# Patient Record
Sex: Female | Born: 1990 | Race: White | Hispanic: No | Marital: Married | Smoking: Never smoker
Health system: Southern US, Community
[De-identification: ages and names within clinical notes are randomized; demographics above are authoritative.]

## PROBLEM LIST (undated history)

## (undated) ENCOUNTER — Inpatient Hospital Stay (HOSPITAL_COMMUNITY): Payer: Self-pay

## (undated) DIAGNOSIS — Z789 Other specified health status: Secondary | ICD-10-CM

## (undated) DIAGNOSIS — R519 Headache, unspecified: Secondary | ICD-10-CM

## (undated) DIAGNOSIS — R87629 Unspecified abnormal cytological findings in specimens from vagina: Secondary | ICD-10-CM

## (undated) DIAGNOSIS — R51 Headache: Secondary | ICD-10-CM

## (undated) DIAGNOSIS — N83209 Unspecified ovarian cyst, unspecified side: Secondary | ICD-10-CM

## (undated) HISTORY — PX: SALPINGOOPHORECTOMY: SHX82

## (undated) HISTORY — PX: BACK SURGERY: SHX140

## (undated) HISTORY — PX: ANKLE SURGERY: SHX546

---
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
## 2011-06-02 ENCOUNTER — Emergency Department (HOSPITAL_COMMUNITY)
Admission: EM | Admit: 2011-06-02 | Discharge: 2011-06-02 | Disposition: A | Discharge status: Home / Self Care | Payer: BC Managed Care – PPO | Attending: Emergency Medicine | Admitting: Emergency Medicine

## 2011-06-02 ENCOUNTER — Encounter: Payer: Self-pay | Admitting: *Deleted

## 2011-06-02 ENCOUNTER — Emergency Department (HOSPITAL_COMMUNITY): Payer: BC Managed Care – PPO

## 2011-06-02 DIAGNOSIS — G609 Hereditary and idiopathic neuropathy, unspecified: Secondary | ICD-10-CM | POA: Insufficient documentation

## 2011-06-02 DIAGNOSIS — M792 Neuralgia and neuritis, unspecified: Secondary | ICD-10-CM

## 2011-06-02 MED ORDER — PREDNISONE 10 MG PO TABS: ORAL_TABLET | ORAL | Status: DC

## 2011-06-02 NOTE — ED Notes (Signed)
C/o tingling in left foot, states it started 2 days ago, has a history of back and foot surgery

## 2011-06-08 NOTE — ED Provider Notes (Signed)
History     CSN: [id number] Arrival date & time: 06/02/2011  3:28 PM   None     Chief Complaint  Patient presents with  . Extremity Weakness    (Consider location/radiation/quality/duration/timing/severity/associated sxs/prior treatment) Patient is a 20 y.o. female presenting with extremity weakness. The history is provided by the patient and a relative.  Extremity Weakness This is a new problem. Episode onset: 2 days ago. The problem occurs constantly. The problem has been unchanged. Pertinent negatives include no abdominal pain, arthralgias, chest pain, congestion, fever, headaches, joint swelling, nausea, neck pain, numbness, rash, sore throat or weakness. Associated symptoms comments: She describes a pins and needles sensation in her left foot,  Sometimes radiating to her mid anterior tibia for the past 2 days.  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  She has also had a achilles tendon release surgery but continues to have an antalgic gait, and chronically walks on the ball of her left foot only.. The symptoms are aggravated by walking and standing. She has tried rest and acetaminophen for the symptoms. The treatment provided no relief.    History reviewed. No pertinent past medical history.  Past Surgical History  Procedure Date  . Salpingoophorectomy   . Back surgery     No family history on file.  History  Substance Use Topics  . Smoking status: Never Smoker   . Smokeless tobacco: Not on file  . Alcohol Use: No    OB History    Grav Para Term Preterm Abortions TAB SAB Ect Mult Living                  Review of Systems  Constitutional: Negative for fever.  HENT: Negative for congestion, sore throat and neck pain.   Eyes: Negative.   Respiratory: Negative for chest tightness and shortness of breath.   Cardiovascular: Negative for chest pain.  Gastrointestinal: Negative for nausea and  abdominal pain.  Genitourinary: Negative.  Negative for dysuria, urgency, decreased urine volume and enuresis.  Musculoskeletal: Positive for extremity weakness. Negative for joint swelling and arthralgias.  Skin: Negative.  Negative for rash and wound.  Neurological: Negative for dizziness, weakness, light-headedness, numbness and headaches.  Hematological: Negative.   Psychiatric/Behavioral: Negative.     Allergies  Dilaudid  Home Medications   Current Outpatient Rx  Name Route Sig Dispense Refill  . IBUPROFEN 200 MG PO TABS Oral Take 800 mg by mouth every 8 (eight) hours as needed. Bad headaches     . LEVONORGEST-ETH ESTRAD 91-DAY 0.15-0.03 MG PO TABS Oral Take 1 tablet by mouth daily.      Marland Kitchen PREDNISONE 10 MG PO TABS  Take 6 tabs daily by mouth for 1 day,  Then 5 tabs daily for 1 day,  4 tabs daily for 1 day,  3 tabs daily for 1 day,  2 tabs daily for 1 day,  Then 1 tab daily for 1 day.   21 tablet 0    BP 126/76  Pulse 100  Temp(Src) 98.9 F (37.2 C) (Oral)  Resp 20  Ht 5' (1.524 m)  Wt 115 lb (52.164 kg)  BMI 22.46 kg/m2  SpO2 100%  LMP 05/21/2011  Physical Exam  Constitutional: She is oriented to person, place, and time. She appears well-developed and well-nourished.  HENT:  Head: Normocephalic.  Eyes: Conjunctivae are normal.  Neck: Normal range of motion. Neck supple.  Cardiovascular: Regular rhythm and intact distal pulses.        Pedal pulses normal.  Pulmonary/Chest: Effort normal. She has no wheezes.  Abdominal: Soft. Bowel sounds are normal. She exhibits no distension and no mass.  Musculoskeletal: She exhibits no edema and no tenderness.       Lumbar back: She exhibits tenderness. She exhibits no swelling, no edema and no spasm.       Old healed midline lumbar incision.  Asymmetry noted to posterior pelvis and L spine.  Neurological: She is alert and oriented to person, place, and time. She has normal strength. She displays no atrophy and no tremor. No  cranial nerve deficit or sensory deficit. Gait normal.  Reflex Scores:      Patellar reflexes are 2+ on the right side and 2+ on the left side.      Achilles reflexes are 2+ on the right side and 2+ on the left side.      No strength deficit noted in hip and knee flexor and extensor muscle groups.  Ankle flexion and extension intact.  Skin: Skin is warm and dry.  Psychiatric: She has a normal mood and affect.    ED Course  Procedures (including critical care time)   Labs Reviewed  POCT PREGNANCY, URINE  LAB REPORT - SCANNED   No results found.   1. Peripheral neuralgia       MDM  Patients labs and/or radiological studies were reviewed during the medical decision making and disposition process.  Referral to neurology for further testing/ nerve study.  Exam and sx suggestive of foot neuropathy rather than lumbar radicular pain.        Candis Musa, PA 06/08/11 2137  Candis Musa, PA 06/09/11 [phone]

## 2011-06-18 NOTE — ED Provider Notes (Signed)
Medical screening examination/treatment/procedure(s) were performed by non-physician practitioner and as supervising physician I was immediately available for consultation/collaboration.  Hurman Horn, MD 06/18/11 [phone]

## 2012-04-14 ENCOUNTER — Encounter (HOSPITAL_COMMUNITY): Payer: Self-pay | Admitting: Emergency Medicine

## 2012-04-14 ENCOUNTER — Emergency Department (HOSPITAL_COMMUNITY)
Admission: EM | Admit: 2012-04-14 | Discharge: 2012-04-14 | Disposition: A | Discharge status: Home / Self Care | Payer: BC Managed Care – PPO | Attending: Emergency Medicine | Admitting: Emergency Medicine

## 2012-04-14 DIAGNOSIS — N390 Urinary tract infection, site not specified: Secondary | ICD-10-CM | POA: Insufficient documentation

## 2012-04-14 LAB — URINALYSIS, ROUTINE W REFLEX MICROSCOPIC
Bilirubin Urine: NEGATIVE
Glucose, UA: NEGATIVE mg/dL
Specific Gravity, Urine: 1.02 (ref 1.005–1.030)

## 2012-04-14 LAB — URINE MICROSCOPIC-ADD ON

## 2012-04-14 LAB — PREGNANCY, URINE: Preg Test, Ur: NEGATIVE

## 2012-04-14 MED ORDER — NITROFURANTOIN MONOHYD MACRO 100 MG PO CAPS
100.0000 mg | ORAL_CAPSULE | Freq: Once | ORAL | Status: AC
  Administered 2012-04-14: 100 mg via ORAL
  Filled 2012-04-14: qty 1

## 2012-04-14 MED ORDER — NITROFURANTOIN MONOHYD MACRO 100 MG PO CAPS: 100.0000 mg | ORAL_CAPSULE | Freq: Two times a day (BID) | ORAL | Status: DC

## 2012-04-14 NOTE — ED Provider Notes (Signed)
History     CSN: [id number]  Arrival date & time 04/14/12  0959   First MD Initiated Contact with Patient 04/14/12 1012      Chief Complaint  Patient presents with  . Urinary Frequency    (Consider location/radiation/quality/duration/timing/severity/associated sxs/prior treatment) HPI Comments: Urinary freq and urgency.  No hematuria or dysuria.  + malodorous.  No fever or chills.  No n/v.  No vaginal d/c   Patient is a 21 y.o. female presenting with frequency. The history is provided by the patient. No language interpreter was used.  Urinary Frequency This is a new problem. The current episode started yesterday. The problem occurs constantly. The problem has been unchanged. Associated symptoms include urinary symptoms. Pertinent negatives include no abdominal pain, chills, diaphoresis, fever, nausea or vomiting. Associated symptoms comments: Back pain . Nothing aggravates the symptoms. She has tried nothing for the symptoms.    History reviewed. No pertinent past medical history.  Past Surgical History  Procedure Date  . Salpingoophorectomy   . Back surgery   . Ankle surgery     No family history on file.  History  Substance Use Topics  . Smoking status: Never Smoker   . Smokeless tobacco: Not on file  . Alcohol Use: No    OB History    Grav Para Term Preterm Abortions TAB SAB Ect Mult Living                  Review of Systems  Constitutional: Negative for fever, chills and diaphoresis.  Gastrointestinal: Negative for nausea, vomiting and abdominal pain.  Genitourinary: Positive for urgency and frequency. Negative for dysuria, hematuria, flank pain, vaginal bleeding and vaginal discharge.  Musculoskeletal: Positive for back pain.  All other systems reviewed and are negative.    Allergies  Dilaudid  Home Medications   Current Outpatient Rx  Name Route Sig Dispense Refill  . IBUPROFEN 200 MG PO TABS Oral Take 800 mg by mouth every 8 (eight) hours as  needed. Bad headaches     . LEVONORGEST-ETH ESTRAD 91-DAY 0.15-0.03 MG PO TABS Oral Take 1 tablet by mouth daily.      Marland Kitchen NITROFURANTOIN MONOHYD MACRO 100 MG PO CAPS Oral Take 1 capsule (100 mg total) by mouth 2 (two) times daily. 10 capsule 0  . PREDNISONE 10 MG PO TABS  Take 6 tabs daily by mouth for 1 day,  Then 5 tabs daily for 1 day,  4 tabs daily for 1 day,  3 tabs daily for 1 day,  2 tabs daily for 1 day,  Then 1 tab daily for 1 day.   21 tablet 0    BP 132/79  Pulse 100  Temp 98.5 F (36.9 C)  Resp 18  Ht 5' (1.524 m)  Wt 120 lb (54.432 kg)  BMI 23.44 kg/m2  SpO2 99%  LMP 01/14/2012  Physical Exam  Nursing note and vitals reviewed. Constitutional: She is oriented to person, place, and time. She appears well-developed and well-nourished. No distress.  HENT:  Head: Normocephalic and atraumatic.  Eyes: EOM are normal.  Neck: Normal range of motion.  Cardiovascular: Normal rate, regular rhythm and normal heart sounds.   Pulmonary/Chest: Effort normal and breath sounds normal.  Abdominal: Soft. She exhibits no distension. There is no tenderness.  Genitourinary: No vaginal discharge found.  Musculoskeletal: Normal range of motion.       Lumbar back: She exhibits pain. She exhibits normal range of motion, no tenderness and no bony tenderness.  Back:  Neurological: She is alert and oriented to person, place, and time.  Skin: Skin is warm and dry.  Psychiatric: She has a normal mood and affect. Judgment normal.    ED Course  Procedures (including critical care time)  Labs Reviewed  URINALYSIS, ROUTINE W REFLEX MICROSCOPIC - Abnormal; Notable for the following:    APPearance CLOUDY (*)     Hgb urine dipstick MODERATE (*)     Nitrite POSITIVE (*)     Leukocytes, UA TRACE (*)     All other components within normal limits  URINE MICROSCOPIC-ADD ON - Abnormal; Notable for the following:    Squamous Epithelial / LPF FEW (*)     Bacteria, UA MANY (*)     All other  components within normal limits  PREGNANCY, URINE  URINE CULTURE   No results found.   1. UTI (urinary tract infection)       MDM  Macrobid, 10 AZO F/u with PCP prn        Evalina Field, PA 04/14/12 1154

## 2012-04-14 NOTE — ED Notes (Signed)
Patient with c/o urinary frequency and pelvic pain that started yesterday.

## 2012-04-14 NOTE — ED Provider Notes (Signed)
Medical screening examination/treatment/procedure(s) were performed by non-physician practitioner and as supervising physician I was immediately available for consultation/collaboration.  Cera Rorke, MD 04/14/12 1459 

## 2012-04-15 ENCOUNTER — Emergency Department (HOSPITAL_COMMUNITY): Payer: BC Managed Care – PPO

## 2012-04-15 ENCOUNTER — Emergency Department (HOSPITAL_COMMUNITY)
Admission: EM | Admit: 2012-04-15 | Discharge: 2012-04-16 | Disposition: A | Discharge status: Home / Self Care | Payer: BC Managed Care – PPO | Attending: Emergency Medicine | Admitting: Emergency Medicine

## 2012-04-15 ENCOUNTER — Encounter (HOSPITAL_COMMUNITY): Payer: Self-pay | Admitting: *Deleted

## 2012-04-15 DIAGNOSIS — R109 Unspecified abdominal pain: Secondary | ICD-10-CM | POA: Insufficient documentation

## 2012-04-15 DIAGNOSIS — Z79899 Other long term (current) drug therapy: Secondary | ICD-10-CM | POA: Insufficient documentation

## 2012-04-15 DIAGNOSIS — N12 Tubulo-interstitial nephritis, not specified as acute or chronic: Secondary | ICD-10-CM | POA: Insufficient documentation

## 2012-04-15 DIAGNOSIS — N200 Calculus of kidney: Secondary | ICD-10-CM | POA: Insufficient documentation

## 2012-04-15 LAB — URINALYSIS, ROUTINE W REFLEX MICROSCOPIC
Leukocytes, UA: NEGATIVE
Nitrite: POSITIVE — AB
Specific Gravity, Urine: 1.025 (ref 1.005–1.030)
pH: 5 (ref 5.0–8.0)

## 2012-04-15 LAB — PREGNANCY, URINE: Preg Test, Ur: NEGATIVE

## 2012-04-15 LAB — URINE MICROSCOPIC-ADD ON

## 2012-04-15 MED ORDER — MORPHINE SULFATE 2 MG/ML IJ SOLN
INTRAMUSCULAR | Status: AC
  Administered 2012-04-16: 6 mg via INTRAVENOUS
  Filled 2012-04-15: qty 1

## 2012-04-15 MED ORDER — SODIUM CHLORIDE 0.9 % IV SOLN
1000.0000 mL | Freq: Once | INTRAVENOUS | Status: AC
  Administered 2012-04-16: 1000 mL via INTRAVENOUS

## 2012-04-15 MED ORDER — MORPHINE SULFATE 2 MG/ML IJ SOLN
INTRAMUSCULAR | Status: AC
  Filled 2012-04-15: qty 2

## 2012-04-15 MED ORDER — ONDANSETRON HCL 4 MG/2ML IJ SOLN
4.0000 mg | Freq: Once | INTRAMUSCULAR | Status: AC
  Administered 2012-04-16: 4 mg via INTRAVENOUS
  Filled 2012-04-15: qty 2

## 2012-04-15 MED ORDER — MORPHINE SULFATE 4 MG/ML IJ SOLN: 6.0000 mg | Freq: Once | INTRAMUSCULAR | Status: AC

## 2012-04-15 MED ORDER — SODIUM CHLORIDE 0.9 % IV SOLN: 1000.0000 mL | INTRAVENOUS | Status: DC

## 2012-04-15 MED ORDER — ACETAMINOPHEN 500 MG PO TABS
1000.0000 mg | ORAL_TABLET | Freq: Once | ORAL | Status: AC
  Administered 2012-04-16: 1000 mg via ORAL
  Filled 2012-04-15: qty 2

## 2012-04-15 MED ORDER — CEFTRIAXONE SODIUM 1 G IJ SOLR
1.0000 g | Freq: Once | INTRAMUSCULAR | Status: AC
  Administered 2012-04-16: 1 g via INTRAMUSCULAR
  Filled 2012-04-15: qty 10

## 2012-04-15 NOTE — ED Notes (Signed)
Pt states R upper abd pain started this am. Pt has had N/V no diarrhea. Pt has not had pain of this type before

## 2012-04-15 NOTE — ED Provider Notes (Signed)
History   This chart was scribed for Kathleen Co, MD by [name]. The patient was seen in room [facility] and the patient's care was started at 11:29 PM    CSN: [id number]  Arrival date & time 04/15/12  2205   First MD Initiated Contact with Patient 04/15/12 2309      Chief Complaint  Patient presents with  . Abdominal Pain  . Emesis    (Consider location/radiation/quality/duration/timing/severity/associated sxs/prior treatment) Patient is a 21 y.o. female presenting with abdominal pain. The history is provided by the patient. No language interpreter was used.  Abdominal Pain The primary symptoms of the illness include abdominal pain, fever (101.9), nausea and vomiting. The current episode started 6 to 12 hours ago. The onset of the illness was sudden. The problem has been gradually worsening.  The abdominal pain began 6 to 12 hours ago. The pain came on suddenly. The abdominal pain has been gradually worsening since its onset. The abdominal pain is located in the RUQ. The abdominal pain does not radiate.  The fever began today. The maximum temperature recorded prior to her arrival was 101 to 101.9 F. The temperature was taken by an oral thermometer.  Nausea began today.  The vomiting began today. Vomiting occurred once. The emesis contains stomach contents.    Kathleen Rich is a 21 y.o. female , with a hx of UTI, who presents to the Emergency Department complaining of sudden, progressively worsening, abdominal pain located at the right side of the abdomen onset today with associated symptoms of vomiting, nausea, back pain, and fever (101.9 at [facility]). The pt reports she was experiencing suprapubic abdominal pain yesterday where which she was diagnosed with a UTI by Al Decant at [facility]. The pt informs that the abdominal pain she was experiencing yesterday has resolved, however, she is experiencing a right sided pain at present. The pt has a hx of UTI (diagnosed with Al Decant  yesterday, 04/14/12).   The pt does not smoke or drink alcohol.      History reviewed. No pertinent past medical history.  Past Surgical History  Procedure Date  . Salpingoophorectomy   . Back surgery   . Ankle surgery     History reviewed. No pertinent family history.  History  Substance Use Topics  . Smoking status: Never Smoker   . Smokeless tobacco: Not on file  . Alcohol Use: No    OB History    Grav Para Term Preterm Abortions TAB SAB Ect Mult Living                  Review of Systems  Constitutional: Positive for fever (101.9).  Gastrointestinal: Positive for nausea, vomiting and abdominal pain.  All other systems reviewed and are negative.    Allergies  Dilaudid  Home Medications   Current Outpatient Rx  Name Route Sig Dispense Refill  . IBUPROFEN 200 MG PO TABS Oral Take 800 mg by mouth every 8 (eight) hours as needed. Bad headaches     . LEVONORGEST-ETH ESTRAD 91-DAY 0.15-0.03 MG PO TABS Oral Take 1 tablet by mouth daily.      Marland Kitchen NITROFURANTOIN MONOHYD MACRO 100 MG PO CAPS Oral Take 1 capsule (100 mg total) by mouth 2 (two) times daily. 10 capsule 0  . AZO TABS PO Oral Take 1 tablet by mouth daily as needed. For urinary pain relief      BP 145/84  Pulse 127  Temp 100.2 F (37.9 C) (Oral)  Resp 20  Ht 5' (1.524 m)  Wt 120 lb (54.432 kg)  BMI 23.44 kg/m2  SpO2 100%  LMP 01/14/2012  Physical Exam  Nursing note and vitals reviewed. Constitutional: She is oriented to person, place, and time. She appears well-developed and well-nourished. No distress.  HENT:  Head: Normocephalic and atraumatic.  Eyes: EOM are normal. Pupils are equal, round, and reactive to light.  Neck: Normal range of motion. Neck supple. No tracheal deviation present.  Cardiovascular: Normal rate, regular rhythm and normal heart sounds.   Pulmonary/Chest: Effort normal and breath sounds normal. No respiratory distress.  Abdominal: Soft. She exhibits no distension. There is  tenderness (Mild right sided abdominal tenderness. ).  Musculoskeletal: Normal range of motion. She exhibits tenderness (Right CVA tenderness. ). She exhibits no edema.  Neurological: She is alert and oriented to person, place, and time. No sensory deficit.  Skin: Skin is warm and dry.  Psychiatric: She has a normal mood and affect. Her behavior is normal. Judgment normal.    ED Course  Procedures (including critical care time)  DIAGNOSTIC STUDIES: Oxygen Saturation is 100% on room air, normal by my interpretation.    COORDINATION OF CARE:    11:33PM- IV antibiotics, CT scan, nausea management, pain management, review of previous visit with Al Decant on 04/14/12, and possible kidney stones discussed. Pt agrees with treatment.   Labs Reviewed  URINALYSIS, ROUTINE W REFLEX MICROSCOPIC - Abnormal; Notable for the following:    Glucose, UA 100 (*)     Hgb urine dipstick SMALL (*)     Protein, ur TRACE (*)     Nitrite POSITIVE (*)     All other components within normal limits  URINE MICROSCOPIC-ADD ON - Abnormal; Notable for the following:    Squamous Epithelial / LPF MANY (*)     Bacteria, UA MANY (*)     All other components within normal limits  CBC - Abnormal; Notable for the following:    WBC 13.5 (*)     All other components within normal limits  COMPREHENSIVE METABOLIC PANEL - Abnormal; Notable for the following:    Potassium 3.4 (*)     All other components within normal limits  PREGNANCY, URINE  LIPASE, BLOOD  URINE CULTURE   Ct Abdomen Pelvis Wo Contrast  04/16/2012  *RADIOLOGY REPORT*  Clinical Data: Right side abdominal pain and fever.  Urinary tract infection.  CT ABDOMEN AND PELVIS WITHOUT CONTRAST  Technique:  Multidetector CT imaging of the abdomen and pelvis was performed following the standard protocol without intravenous contrast.  Comparison: CT abdomen pelvis 12/04/2009.  Findings: The lung bases are clear.  No pleural or pericardial effusion.  The patient  has multiple bilateral nonobstructing renal stones. There is some stranding about the proximal right ureter.  No hydronephrosis or ureteral stones are identified.  The urinary bladder appears normal.  The liver, gallbladder, adrenal glands, spleen and pancreas appear normal.  Uterus and adnexa are unremarkable.  The stomach, small and large bowel and appendix appear normal.  There is no lymphadenopathy or fluid.  Patient reports history of prior back surgery.  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  IMPRESSION:  1.  Stranding about the right renal pelvis and proximal ureter could be due to urinary tract infection or possible recent stone passage.  No ureteral or urinary bladder stone is identified. 2.  Bilateral nonobstructing renal stones.   Original Report Authenticated By: Bernadene Bell. Maricela Curet, M.D.  I personally reviewed the imaging tests through PACS system  I reviewed available ER/hospitalization records thought the EMR   1. Pyelonephritis       MDM  3:01 AM The patient feels much better at this time.  She has right-sided pyelonephritis.  CT scan without evidence of obstructing stone to complicate this.  Her heart rate and pulse rate are significantly improved after IV fluids and tylenol. Will change to 10 day course of keflex. Close pcp follow up. Strict return instructions      I personally performed the services described in this documentation, which was scribed in my presence. The recorded information has been reviewed and considered.      Kathleen Co, MD 04/16/12 [phone]

## 2012-04-15 NOTE — ED Notes (Signed)
Pt c/o ruq abd pain and vomiting.

## 2012-04-16 LAB — CBC
MCH: 32.2 pg (ref 26.0–34.0)
Platelets: 205 10*3/uL (ref 150–400)
RBC: 3.94 MIL/uL (ref 3.87–5.11)

## 2012-04-16 LAB — COMPREHENSIVE METABOLIC PANEL
ALT: 17 U/L (ref 0–35)
AST: 23 U/L (ref 0–37)
CO2: 23 mEq/L (ref 19–32)
Calcium: 9.3 mg/dL (ref 8.4–10.5)
GFR calc non Af Amer: >90 mL/min (ref >90)
Sodium: 136 mEq/L (ref 135–145)
Total Protein: 7.3 g/dL (ref 6.0–8.3)

## 2012-04-16 LAB — URINE CULTURE

## 2012-04-16 MED ORDER — MORPHINE SULFATE 4 MG/ML IJ SOLN: 6.0000 mg | Freq: Once | INTRAMUSCULAR | Status: AC

## 2012-04-16 MED ORDER — ONDANSETRON 8 MG PO TBDP: 8.0000 mg | ORAL_TABLET | Freq: Three times a day (TID) | ORAL | Status: AC | PRN

## 2012-04-16 MED ORDER — OXYCODONE-ACETAMINOPHEN 5-325 MG PO TABS: 1.0000 | ORAL_TABLET | ORAL | Status: AC | PRN

## 2012-04-16 MED ORDER — MORPHINE SULFATE 10 MG/ML IJ SOLN
INTRAMUSCULAR | Status: AC
  Administered 2012-04-16: 6 mg
  Filled 2012-04-16: qty 1

## 2012-04-16 MED ORDER — CEPHALEXIN 500 MG PO CAPS: 500.0000 mg | ORAL_CAPSULE | Freq: Four times a day (QID) | ORAL | Status: AC

## 2012-04-16 NOTE — ED Notes (Signed)
Pt stable at discharge with a steady ambulatory gait; Pt instructed not to take extra tylenol with pain medication and also instructed not to drive while on pain medication. Pt verbalizes understanding. Questions answered. Pt instructed to finish all antibiotics

## 2012-04-16 NOTE — ED Notes (Signed)
Doctor [place] checked the temp  At 00:11. The temp was 101.9 oral.

## 2012-04-17 NOTE — ED Notes (Signed)
+   urine  Patient treated appropriately -sensitive to same-chart appended per protocol MD.  

## 2012-04-18 LAB — URINE CULTURE

## 2012-10-19 ENCOUNTER — Emergency Department (HOSPITAL_COMMUNITY)
Admission: EM | Admit: 2012-10-19 | Discharge: 2012-10-19 | Disposition: A | Discharge status: Home / Self Care | Payer: BC Managed Care – PPO | Attending: Emergency Medicine | Admitting: Emergency Medicine

## 2012-10-19 ENCOUNTER — Encounter (HOSPITAL_COMMUNITY): Payer: Self-pay | Admitting: *Deleted

## 2012-10-19 ENCOUNTER — Emergency Department (HOSPITAL_COMMUNITY): Payer: BC Managed Care – PPO

## 2012-10-19 DIAGNOSIS — Z79899 Other long term (current) drug therapy: Secondary | ICD-10-CM | POA: Insufficient documentation

## 2012-10-19 DIAGNOSIS — S93409A Sprain of unspecified ligament of unspecified ankle, initial encounter: Secondary | ICD-10-CM | POA: Insufficient documentation

## 2012-10-19 DIAGNOSIS — Z9889 Other specified postprocedural states: Secondary | ICD-10-CM | POA: Insufficient documentation

## 2012-10-19 DIAGNOSIS — S93402A Sprain of unspecified ligament of left ankle, initial encounter: Secondary | ICD-10-CM

## 2012-10-19 DIAGNOSIS — Y929 Unspecified place or not applicable: Secondary | ICD-10-CM | POA: Insufficient documentation

## 2012-10-19 DIAGNOSIS — Y9389 Activity, other specified: Secondary | ICD-10-CM | POA: Insufficient documentation

## 2012-10-19 DIAGNOSIS — X58XXXA Exposure to other specified factors, initial encounter: Secondary | ICD-10-CM | POA: Insufficient documentation

## 2012-10-19 MED ORDER — HYDROCODONE-ACETAMINOPHEN 5-325 MG PO TABS
ORAL_TABLET | ORAL | Status: AC
  Administered 2012-10-19: 1 via ORAL
  Filled 2012-10-19: qty 1

## 2012-10-19 MED ORDER — HYDROCODONE-ACETAMINOPHEN 5-325 MG PO TABS
1.0000 | ORAL_TABLET | Freq: Once | ORAL | Status: AC
  Administered 2012-10-19: 1 via ORAL

## 2012-10-19 MED ORDER — IBUPROFEN 600 MG PO TABS: 600.0000 mg | ORAL_TABLET | Freq: Four times a day (QID) | ORAL | Status: DC | PRN

## 2012-10-19 MED ORDER — HYDROCODONE-ACETAMINOPHEN 5-325 MG PO TABS: ORAL_TABLET | ORAL | Status: DC

## 2012-10-19 NOTE — ED Provider Notes (Signed)
History     CSN: [id number]  Arrival date & time 10/19/12  2148   First MD Initiated Contact with Patient 10/19/12 2204      Chief Complaint  Patient presents with  . Ankle Pain    (Consider location/radiation/quality/duration/timing/severity/associated sxs/prior treatment) Patient is a 22 y.o. female presenting with ankle pain. The history is provided by the patient.  Ankle Pain Location:  Ankle Time since incident:  2 days Lower extremity injury: unknown.   Ankle location:  L ankle Pain details:    Quality:  Aching and tingling   Radiates to:  Does not radiate   Severity:  Mild   Onset quality:  Gradual   Timing:  Constant   Progression:  Unchanged Chronicity:  New Dislocation: no   Foreign body present:  No foreign bodies Prior injury to area: patient reports hx of surgery to her left Achille's tendon. Relieved by:  Ice Worsened by:  Nothing tried Ineffective treatments:  None tried Associated symptoms: decreased ROM, swelling and tingling   Associated symptoms: no back pain, no fever, no itching, no muscle weakness, no neck pain, no numbness and no stiffness   Associated symptoms comment:  Bruising to the lateral ankle Risk factors: no frequent fractures, no known bone disorder and no obesity     History reviewed. No pertinent past medical history.  Past Surgical History  Procedure Laterality Date  . Salpingoophorectomy    . Back surgery    . Ankle surgery      History reviewed. No pertinent family history.  History  Substance Use Topics  . Smoking status: Never Smoker   . Smokeless tobacco: Not on file  . Alcohol Use: No    OB History   Grav Para Term Preterm Abortions TAB SAB Ect Mult Living                  Review of Systems  Constitutional: Negative for fever and chills.  HENT: Negative for neck pain.   Genitourinary: Negative for dysuria and difficulty urinating.  Musculoskeletal: Positive for joint swelling and arthralgias. Negative for  back pain and stiffness.       Bruising to the left ankle  Skin: Negative for color change, itching and wound.  Neurological: Negative for weakness and numbness.  Hematological: Does not bruise/bleed easily.  All other systems reviewed and are negative.    Allergies  Dilaudid  Home Medications   Current Outpatient Rx  Name  Route  Sig  Dispense  Refill  . levonorgestrel-ethinyl estradiol (SEASONALE,INTROVALE,JOLESSA) 0.15-0.03 MG tablet   Oral   Take 1 tablet by mouth daily.             BP 135/79  Pulse 95  Temp(Src) 97.3 F (36.3 C) (Oral)  Resp 18  Ht 4\' 11"  (1.499 m)  Wt 120 lb (54.432 kg)  BMI 24.22 kg/m2  SpO2 100%  Physical Exam  Nursing note and vitals reviewed. Constitutional: She is oriented to person, place, and time. She appears well-developed and well-nourished. No distress.  HENT:  Head: Normocephalic and atraumatic.  Cardiovascular: Normal rate, regular rhythm, normal heart sounds and intact distal pulses.   Pulmonary/Chest: Effort normal and breath sounds normal. No respiratory distress.  Musculoskeletal: She exhibits edema and tenderness.  Left lateral ankle is ttp, mild STS and bruising are present.  ROM is preserved.  DP pulse is brisk, distal sensation intact.  No erythema, abrasion,or bony deformity.  No  Proximal tenderness  Neurological: She is alert and oriented  to person, place, and time. She exhibits normal muscle tone. Coordination normal.  Skin: Skin is warm and dry.    ED Course  Procedures (including critical care time)  Labs Reviewed - No data to display No results found.    Dg Ankle Complete Left  10/19/2012  *RADIOLOGY REPORT*  Clinical Data: 22 year old female left lateral ankle pain with swelling and bruising.  LEFT ANKLE COMPLETE - 3+ VIEW  Comparison: None.  Findings: Bone mineralization is within normal limits.  No joint effusion identified.  Calcaneus intact.  Mortise joint alignment within normal limits.  Talar dome intact.   No fracture or dislocation identified.  IMPRESSION: No acute fracture or dislocation identified about the left ankle.   Original Report Authenticated By: Erskine Speed, M.D.     MDM   ASO ankle brace applied and crutches given.  Pain improved.  Remains NV intact.  No proximal edema or tenderness. Likely sprain.   Pt agrees to RICE therapy and f/u with Dr. Romeo Apple if needed.    Prescribed:  Ibuprofen norco #12      Diante Barley L. Mondamin, [place] 10/19/12 2319

## 2012-10-19 NOTE — ED Notes (Signed)
Pt c/o left ankle pain x 2 days 

## 2012-10-21 NOTE — ED Provider Notes (Signed)
Medical screening examination/treatment/procedure(s) were performed by non-physician practitioner and as supervising physician I was immediately available for consultation/collaboration.  Barbarajean Kinzler, MD 10/21/12 0659 

## 2012-11-13 ENCOUNTER — Encounter: Payer: Self-pay | Admitting: Orthopedic Surgery

## 2012-11-13 ENCOUNTER — Ambulatory Visit (INDEPENDENT_AMBULATORY_CARE_PROVIDER_SITE_OTHER): Payer: BC Managed Care – PPO | Admitting: Orthopedic Surgery

## 2012-11-13 VITALS — BP 102/64 | Ht 60.0 in | Wt 123.0 lb

## 2012-11-13 DIAGNOSIS — Q667 Congenital pes cavus, unspecified foot: Secondary | ICD-10-CM

## 2012-11-13 DIAGNOSIS — Q675 Congenital deformity of spine: Secondary | ICD-10-CM

## 2012-11-13 DIAGNOSIS — M216X9 Other acquired deformities of unspecified foot: Secondary | ICD-10-CM

## 2012-11-13 DIAGNOSIS — M24576 Contracture, unspecified foot: Secondary | ICD-10-CM

## 2012-11-13 DIAGNOSIS — Q059 Spina bifida, unspecified: Secondary | ICD-10-CM

## 2012-11-13 DIAGNOSIS — M24575 Contracture, left foot: Secondary | ICD-10-CM

## 2012-11-13 NOTE — Progress Notes (Signed)
Patient ID: Kathleen Rich, female   DOB: [birth year], 22 y.o.   MRN: [id number] Chief Complaint  Patient presents with  . Ankle Pain    Left ankle and foot pain    22 year old female presents with lateral foot and ankle pain and a feeling that her circulation is poor as the foot states cold.  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 She was initially treated at [facility] but has not been there since a youngster.  She went to the emergency room with pain in her foot and ankle for 2-3 months which is worse after standing at work.  She describes a cold feeling of the foot numbness and tingling swelling 8/10 throbbing pain which is worse by standing  Review of systems blurred vision nausea diarrhea swelling of the foot and ankle numbness and tingling in the foot she denies weight loss chest pain shortness of breath genitourinary symptoms of frequency or urgency. Denies easy bleeding or bruising  History reviewed. No pertinent past medical history.  Past Surgical History  Procedure Laterality Date  . Salpingoophorectomy    . Back surgery    . Ankle surgery      History  Substance Use Topics  . Smoking status: Never Smoker   . Smokeless tobacco: Not on file  . Alcohol Use: No     Vital signs are stable as recorded  General appearance is normal  The patient is alert and oriented x3  The patient's mood and affect are normal  Gait assessment: She does have an altered gait mainly because she cannot bring the left foot down to the floor in a plantigrade fashion and she has a leg length discrepancy which is approximately 1 cm    The cardiovascular exam reveals normal pulses but abnormal temperature with a cool feeling to the left foot compared to the right, however without edema or  swelling.  The lymphatic system is negative for  palpable lymph nodes  The sensory exam is normal.  There are no pathologic reflexes.  Balance is normal.   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 Come in the ankle joint feels stable. There is mild atrophy and the foot is smaller in length and overall appearance  The x-ray of the ankle showed no acute fracture  We are going to get the patient connected back to [facility] where she can have foot and ankle consultation along with spinal/neurosurgical evaluation and further treatment  Diagnosis plantar flexion contracture Spinal dysraphism Foot deformity Leg length discrepancy

## 2012-11-13 NOTE — Patient Instructions (Signed)
Refer back to [facility]

## 2012-12-30 ENCOUNTER — Telehealth: Payer: Self-pay | Admitting: *Deleted

## 2012-12-30 NOTE — Telephone Encounter (Signed)
Patient cancelled appointment with Dr. Oswaldo Done per receptionist at [facility]

## 2014-06-02 ENCOUNTER — Encounter: Payer: Self-pay | Admitting: Nurse Practitioner

## 2014-07-30 ENCOUNTER — Encounter (HOSPITAL_COMMUNITY): Payer: Self-pay

## 2014-07-30 ENCOUNTER — Emergency Department (HOSPITAL_COMMUNITY): Payer: BC Managed Care – PPO

## 2014-07-30 ENCOUNTER — Emergency Department (HOSPITAL_COMMUNITY)
Admission: EM | Admit: 2014-07-30 | Discharge: 2014-07-30 | Disposition: A | Discharge status: Home / Self Care | Payer: BC Managed Care – PPO | Attending: Emergency Medicine | Admitting: Emergency Medicine

## 2014-07-30 DIAGNOSIS — M25561 Pain in right knee: Secondary | ICD-10-CM | POA: Diagnosis present

## 2014-07-30 DIAGNOSIS — R52 Pain, unspecified: Secondary | ICD-10-CM

## 2014-07-30 DIAGNOSIS — Z79899 Other long term (current) drug therapy: Secondary | ICD-10-CM | POA: Diagnosis not present

## 2014-07-30 DIAGNOSIS — Z9889 Other specified postprocedural states: Secondary | ICD-10-CM | POA: Insufficient documentation

## 2014-07-30 MED ORDER — TRAMADOL HCL 50 MG PO TABS: 50.0000 mg | ORAL_TABLET | Freq: Four times a day (QID) | ORAL | Status: DC | PRN

## 2014-07-30 NOTE — ED Notes (Signed)
Pt c/o of right knee pain. Denies injury. States left leg shorter than right leg and compensates by bearing weight only on right foot.

## 2014-07-30 NOTE — Discharge Instructions (Signed)

## 2014-08-01 NOTE — ED Provider Notes (Signed)
CSN: [id number]     Arrival date & time 07/30/14  1656 History   First MD Initiated Contact with Patient 07/30/14 1734     Chief Complaint  Patient presents with  . Knee Pain     (Consider location/radiation/quality/duration/timing/severity/associated sxs/prior Treatment) Patient is a 23 y.o. female presenting with knee pain. The history is provided by the patient.  Knee Pain Location:  Knee Injury: no   Knee location:  R knee Pain details:    Quality:  Aching   Radiates to:  Does not radiate   Severity:  Moderate   Onset quality:  Gradual   Duration:  2 days   Pain timing: constant with weight bearing,  better at rest.   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 No pertinent past medical history. Past Surgical History  Procedure Laterality Date  . Salpingoophorectomy    . Back surgery    . Ankle surgery     History reviewed. No pertinent family history. History  Substance Use Topics  . Smoking status: Never Smoker   . Smokeless tobacco: Not on file  . Alcohol Use: No   OB History    No data available     Review of Systems  Constitutional: Negative for fever.  Musculoskeletal: Positive for joint swelling and arthralgias. Negative for myalgias and back pain.  Neurological: Negative for weakness and numbness.      Allergies  Dilaudid  Home Medications   Prior to Admission medications   Medication Sig Start Date End Date  Taking? Authorizing Provider  HYDROcodone-acetaminophen (NORCO/VICODIN) 5-325 MG per tablet Take one tab po q 4-6 hrs prn pain 10/19/12   Tammy L. Triplett, PA-C  ibuprofen (ADVIL,MOTRIN) 600 MG tablet Take 1 tablet (600 mg total) by mouth every 6 (six) hours as needed for pain. Take with food 10/19/12   Tammy L. Triplett, PA-C  levonorgestrel-ethinyl estradiol (SEASONALE,INTROVALE,JOLESSA) 0.15-0.03 MG tablet Take 1 tablet by mouth daily.      Historical Provider, MD  traMADol (ULTRAM) 50 MG tablet Take 1 tablet (50 mg total) by mouth every 6 (six) hours as needed. 07/30/14   Burgess AmorJulie Marquarius Lofton, PA-C   BP 122/70 mmHg  Pulse 91  Temp(Src) 98.9 F (37.2 C) (Oral)  Resp 16  Ht 5' (1.524 m)  Wt 125 lb (56.7 kg)  BMI 24.41 kg/m2  SpO2 100%  LMP 07/30/2014 Physical Exam  Constitutional: She appears well-developed and well-nourished.  HENT:  Head: Atraumatic.  Neck: Normal range of motion.  Cardiovascular:  Pulses equal bilaterally  Musculoskeletal: She exhibits tenderness.       Right knee: She exhibits no swelling, no effusion, no deformity, normal alignment, no LCL laxity, normal meniscus and no MCL laxity. Tenderness found. Medial joint line tenderness noted.  Neurological: She is alert. She has normal strength. She displays normal reflexes. No sensory deficit.  Skin: Skin is warm and dry.  Psychiatric: She has a normal mood and affect.  ED Course  Procedures (including critical care time) Labs Review Labs Reviewed - No data to display  Imaging Review Dg Knee Complete 4 Views Right  07/30/2014   CLINICAL DATA:  Right anterior and medial knee pain for 3 days. No injury.  EXAM: RIGHT KNEE - COMPLETE 4+ VIEW  COMPARISON:  None.  FINDINGS: There is no evidence of fracture, dislocation, or joint effusion. There is no evidence of arthropathy or other focal bone abnormality. Soft tissues are unremarkable.  IMPRESSION: No acute fracture or dislocation.   Electronically Signed   By: Sherian ReinWei-Chen  Lin  M.D.   On: 07/30/2014 18:26     EKG Interpretation None      MDM   Final diagnoses:  Pain  Knee pain, acute, right    Patients labs and/or radiological studies were viewed and considered during the medical decision making and disposition process. Pt reports in the past has [facility].  Had seen Dr. Romeo AppleHarrison as a teenager who referred [facility].  Stopped going when she lost her insurance.  She is now insured again and is desirous of re-establishing ortho care.  She has had back and left foot/ankle surgeries to correct her congenital issues, but did not complete the treatment per above.  She was prescribed tramadol, encouraged activity as tolerated, suggested shoe insert for the left foot.  She reports as a child had a built up left shoe, but refused to wear as a teenager.  Referral to [facility] prn.      Burgess AmorJulie Neil Brickell, PA-C 08/01/14 1223  Samuel JesterKathleen McManus, DO 08/02/14 [phone]

## 2014-09-01 IMAGING — CR DG ANKLE COMPLETE 3+V*L*
3 series · 3 of 3 positions shown · non-contrast
Comparison: None.

CLINICAL DATA: 21-year-old female left lateral ankle pain with
swelling and bruising.

LEFT ANKLE COMPLETE - 3+ VIEW

[view not recorded (1 of 3)]
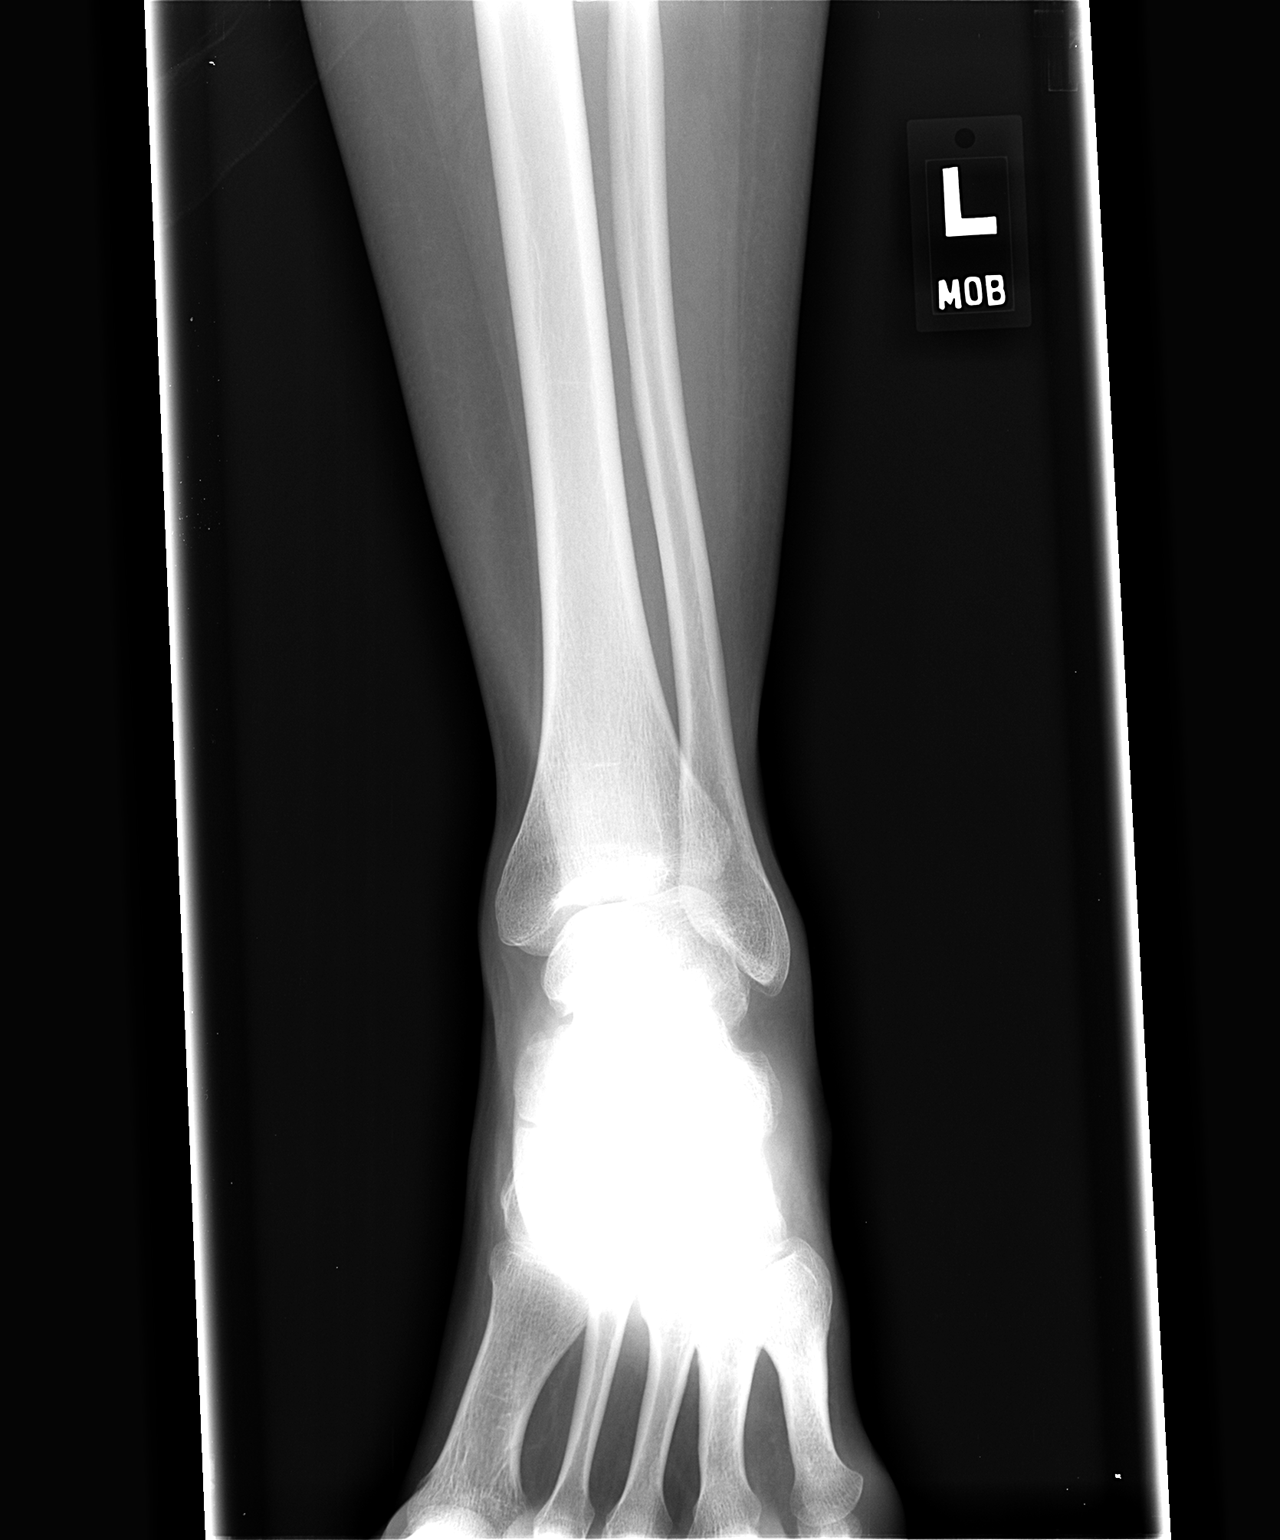

[view not recorded (2 of 3)]
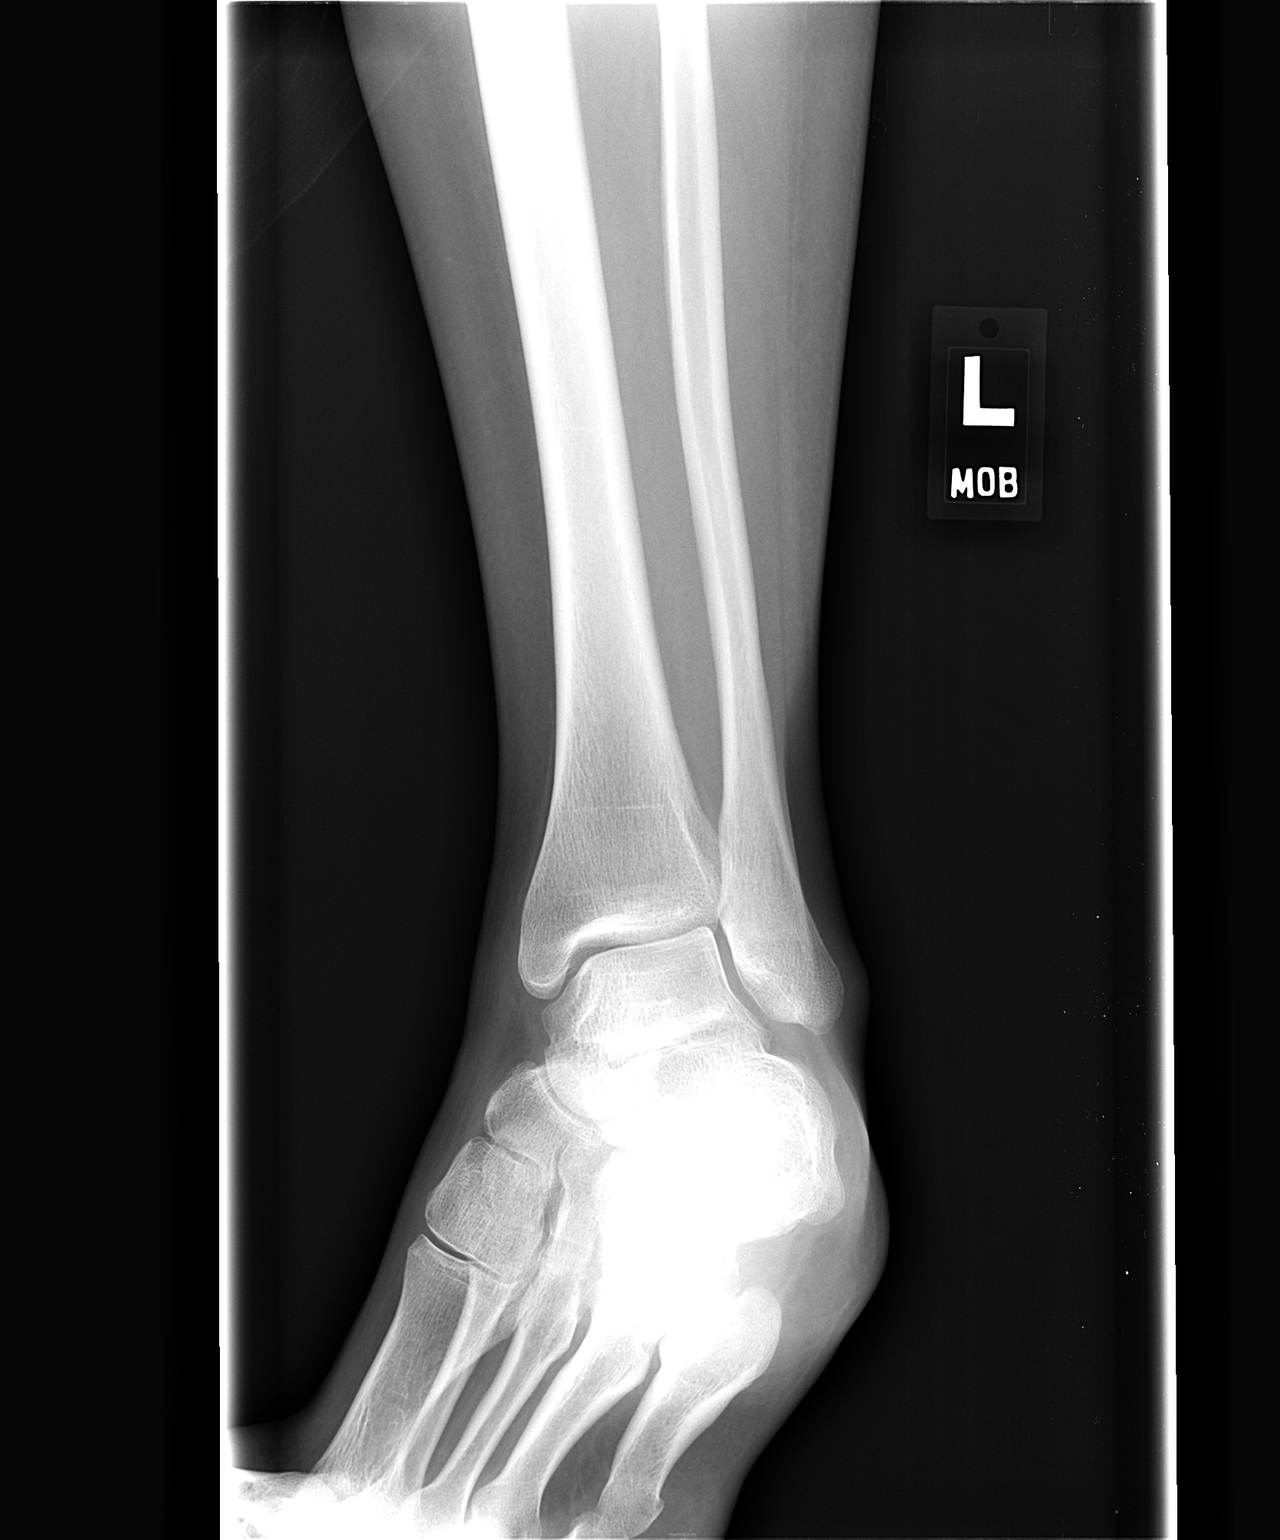

[view not recorded (3 of 3)]
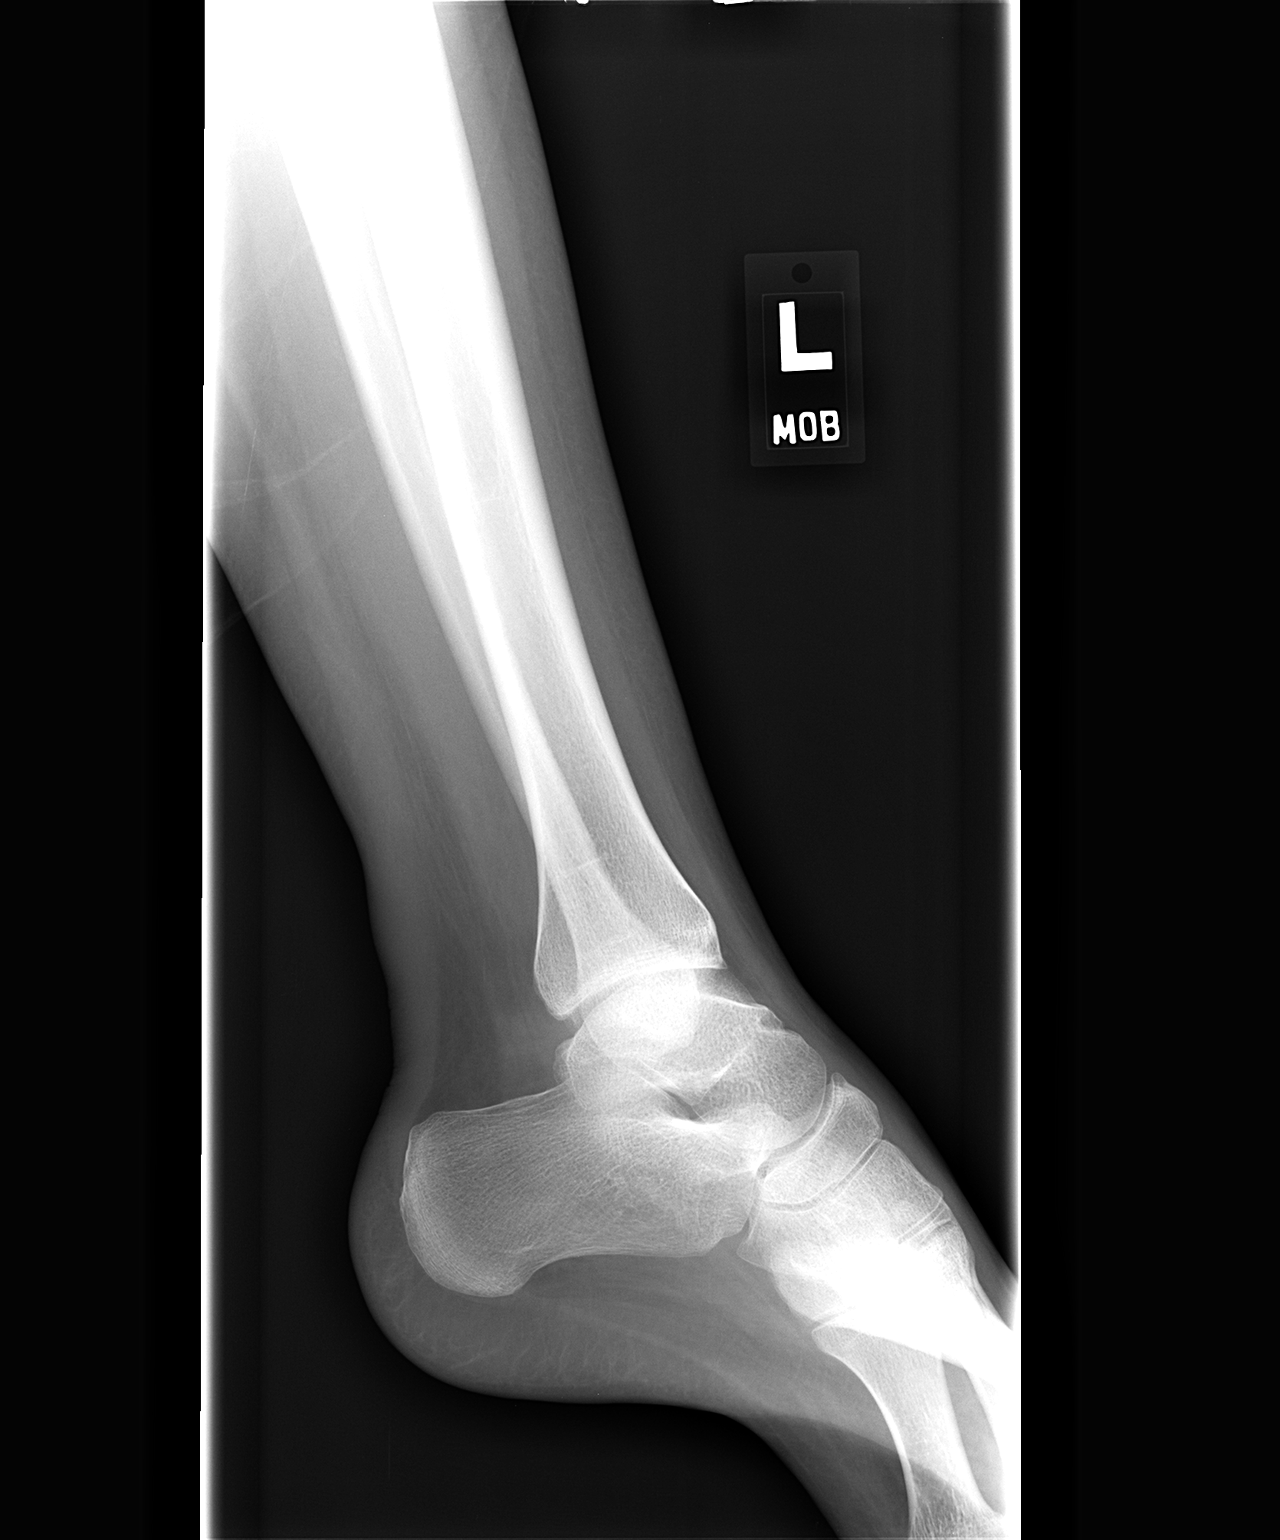

[3 of 3 positions shown; findings below may reference images not displayed]

FINDINGS: Bone mineralization is within normal limits.  No joint
effusion identified.  Calcaneus intact.  Mortise joint alignment
within normal limits.  Talar dome intact.  No fracture or
dislocation identified.
IMPRESSION: No acute fracture or dislocation identified about the left ankle.

## 2014-10-26 LAB — OB RESULTS CONSOLE GC/CHLAMYDIA
Chlamydia: NEGATIVE
GC PROBE AMP, GENITAL: NEGATIVE

## 2014-10-26 LAB — OB RESULTS CONSOLE RPR: RPR: NONREACTIVE

## 2014-10-26 LAB — OB RESULTS CONSOLE RUBELLA ANTIBODY, IGM: Rubella: IMMUNE

## 2014-10-26 LAB — OB RESULTS CONSOLE HIV ANTIBODY (ROUTINE TESTING): HIV: NONREACTIVE

## 2014-10-26 LAB — OB RESULTS CONSOLE HEPATITIS B SURFACE ANTIGEN: Hepatitis B Surface Ag: NEGATIVE

## 2015-03-06 ENCOUNTER — Inpatient Hospital Stay (HOSPITAL_COMMUNITY): Payer: BLUE CROSS/BLUE SHIELD

## 2015-03-06 ENCOUNTER — Encounter (HOSPITAL_COMMUNITY): Payer: Self-pay | Admitting: *Deleted

## 2015-03-06 ENCOUNTER — Inpatient Hospital Stay (HOSPITAL_COMMUNITY)
Admission: AD | Admit: 2015-03-06 | Discharge: 2015-03-06 | Disposition: A | Discharge status: Home / Self Care | Payer: BLUE CROSS/BLUE SHIELD | Source: Ambulatory Visit | Attending: Obstetrics and Gynecology | Admitting: Obstetrics and Gynecology

## 2015-03-06 DIAGNOSIS — O368190 Decreased fetal movements, unspecified trimester, not applicable or unspecified: Secondary | ICD-10-CM | POA: Insufficient documentation

## 2015-03-06 DIAGNOSIS — Z3A28 28 weeks gestation of pregnancy: Secondary | ICD-10-CM | POA: Insufficient documentation

## 2015-03-06 DIAGNOSIS — O4703 False labor before 37 completed weeks of gestation, third trimester: Secondary | ICD-10-CM

## 2015-03-06 DIAGNOSIS — O368130 Decreased fetal movements, third trimester, not applicable or unspecified: Secondary | ICD-10-CM | POA: Insufficient documentation

## 2015-03-06 DIAGNOSIS — O4702 False labor before 37 completed weeks of gestation, second trimester: Secondary | ICD-10-CM

## 2015-03-06 HISTORY — DX: Other specified health status: Z78.9

## 2015-03-06 LAB — URINALYSIS, ROUTINE W REFLEX MICROSCOPIC
Bilirubin Urine: NEGATIVE
GLUCOSE, UA: NEGATIVE mg/dL
Hgb urine dipstick: NEGATIVE
Ketones, ur: NEGATIVE mg/dL
LEUKOCYTES UA: NEGATIVE
Nitrite: NEGATIVE
PROTEIN: NEGATIVE mg/dL
SPECIFIC GRAVITY, URINE: 1.02 (ref 1.005–1.030)
Urobilinogen, UA: 0.2 mg/dL (ref 0.0–1.0)
pH: 6.5 (ref 5.0–8.0)

## 2015-03-06 LAB — FETAL FIBRONECTIN: Fetal Fibronectin: NEGATIVE

## 2015-03-06 MED ORDER — LACTATED RINGERS IV BOLUS (SEPSIS)
1000.0000 mL | Freq: Once | INTRAVENOUS | Status: AC
  Administered 2015-03-06: 1000 mL via INTRAVENOUS

## 2015-03-06 NOTE — MAU Note (Signed)
Patient presents at 7428 weeks gestation with c/o decreased fetal movement since yesterday. Denies pain, bleeding or discharge.

## 2015-03-06 NOTE — MAU Note (Signed)
Urine Sent To Lab 

## 2015-03-06 NOTE — Discharge Instructions (Signed)
Preterm Labor Information °Preterm labor is when labor starts at less than 37 weeks of pregnancy. The normal length of a pregnancy is 39 to 41 weeks. °CAUSES °Often, there is no identifiable underlying cause as to why a woman goes into preterm labor. One of the most common known causes of preterm labor is infection. Infections of the uterus, cervix, vagina, amniotic sac, bladder, kidney, or even the lungs (pneumonia) can cause labor to start. Other suspected causes of preterm labor include:  °· Urogenital infections, such as yeast infections and bacterial vaginosis.   °· Uterine abnormalities (uterine shape, uterine septum, fibroids, or bleeding from the placenta).   °· A cervix that has been operated on (it may fail to stay closed).   °· Malformations in the fetus.   °· Multiple gestations (twins, triplets, and so on).   °· Breakage of the amniotic sac.   °RISK FACTORS °· Having a previous history of preterm labor.   °· Having premature rupture of membranes (PROM).   °· Having a placenta that covers the opening of the cervix (placenta previa).   °· Having a placenta that separates from the uterus (placental abruption).   °· Having a cervix that is too weak to hold the fetus in the uterus (incompetent cervix).   °· Having too much fluid in the amniotic sac (polyhydramnios).   °· Taking illegal drugs or smoking while pregnant.   °· Not gaining enough weight while pregnant.   °· Being younger than 18 and older than 26 years old.   °· Having a low socioeconomic status.   °· Being African American. °SYMPTOMS °Signs and symptoms of preterm labor include:  °· Menstrual-like cramps, abdominal pain, or back pain. °· Uterine contractions that are regular, as frequent as six in an hour, regardless of their intensity (may be mild or painful). °· Contractions that start on the top of the uterus and spread down to the lower abdomen and back.   °· A sense of increased pelvic pressure.   °· A watery or bloody mucus discharge that  comes from the vagina.   °TREATMENT °Depending on the length of the pregnancy and other circumstances, your health care provider may suggest bed rest. If necessary, there are medicines that can be given to stop contractions and to mature the fetal lungs. If labor happens before 34 weeks of pregnancy, a prolonged hospital stay may be recommended. Treatment depends on the condition of both you and the fetus.  °WHAT SHOULD YOU DO IF YOU THINK YOU ARE IN PRETERM LABOR? °Call your health care provider right away. You will need to go to the hospital to get checked immediately. °HOW CAN YOU PREVENT PRETERM LABOR IN FUTURE PREGNANCIES? °You should:  °· Stop smoking if you smoke.  °· Maintain healthy weight gain and avoid chemicals and drugs that are not necessary. °· Be watchful for any type of infection. °· Inform your health care provider if you have a known history of preterm labor. °Document Released: 10/28/2003 Document Revised: 04/09/2013 Document Reviewed: 09/09/2012 °Exit[employer]® Patient Information ©2015 [employer]. This information is not intended to replace advice given to you by your health care provider. Make sure you discuss any questions you have with your health care provider. °Fetal Movement Counts °Patient Name: __________________________________________________ Patient Due Date: ____________________ °Performing a fetal movement count is highly recommended in high-risk pregnancies, but it is good for every pregnant woman to do. Your health care provider may ask you to start counting fetal movements at 28 weeks of the pregnancy. Fetal movements often increase: °· After eating a full meal. °· After physical activity. °· After   eating or drinking something sweet or cold. °· At rest. °Pay attention to when you feel the baby is most active. This will help you notice a pattern of your baby's sleep and wake cycles and what factors contribute to an increase in fetal movement. It is important to perform a fetal  movement count at the same time each day when your baby is normally most active.  °HOW TO COUNT FETAL MOVEMENTS °· Find a quiet and comfortable area to sit or lie down on your left side. Lying on your left side provides the best blood and oxygen circulation to your baby. °· Write down the day and time on a sheet of paper or in a journal. °· Start counting kicks, flutters, swishes, rolls, or jabs in a 2-hour period. You should feel at least 10 movements within 2 hours. °· If you do not feel 10 movements in 2 hours, wait 2-3 hours and count again. Look for a change in the pattern or not enough counts in 2 hours. °SEEK MEDICAL CARE IF: °· You feel less than 10 counts in 2 hours, tried twice. °· There is no movement in over an hour. °· The pattern is changing or taking longer each day to reach 10 counts in 2 hours. °· You feel the baby is not moving as he or she usually does. °Date: ____________ Movements: ____________ Start time: ____________ Finish time: ____________  °Date: ____________ Movements: ____________ Start time: ____________ Finish time: ____________ °Date: ____________ Movements: ____________ Start time: ____________ Finish time: ____________ °Date: ____________ Movements: ____________ Start time: ____________ Finish time: ____________ °Date: ____________ Movements: ____________ Start time: ____________ Finish time: ____________ °Date: ____________ Movements: ____________ Start time: ____________ Finish time: ____________ °Date: ____________ Movements: ____________ Start time: ____________ Finish time: ____________ °Date: ____________ Movements: ____________ Start time: ____________ Finish time: ____________  °Date: ____________ Movements: ____________ Start time: ____________ Finish time: ____________ °Date: ____________ Movements: ____________ Start time: ____________ Finish time: ____________ °Date: ____________ Movements: ____________ Start time: ____________ Finish time: ____________ °Date:  ____________ Movements: ____________ Start time: ____________ Finish time: ____________ °Date: ____________ Movements: ____________ Start time: ____________ Finish time: ____________ °Date: ____________ Movements: ____________ Start time: ____________ Finish time: ____________ °Date: ____________ Movements: ____________ Start time: ____________ Finish time: ____________  °Date: ____________ Movements: ____________ Start time: ____________ Finish time: ____________ °Date: ____________ Movements: ____________ Start time: ____________ Finish time: ____________ °Date: ____________ Movements: ____________ Start time: ____________ Finish time: ____________ °Date: ____________ Movements: ____________ Start time: ____________ Finish time: ____________ °Date: ____________ Movements: ____________ Start time: ____________ Finish time: ____________ °Date: ____________ Movements: ____________ Start time: ____________ Finish time: ____________ °Date: ____________ Movements: ____________ Start time: ____________ Finish time: ____________  °Date: ____________ Movements: ____________ Start time: ____________ Finish time: ____________ °Date: ____________ Movements: ____________ Start time: ____________ Finish time: ____________ °Date: ____________ Movements: ____________ Start time: ____________ Finish time: ____________ °Date: ____________ Movements: ____________ Start time: ____________ Finish time: ____________ °Date: ____________ Movements: ____________ Start time: ____________ Finish time: ____________ °Date: ____________ Movements: ____________ Start time: ____________ Finish time: ____________ °Date: ____________ Movements: ____________ Start time: ____________ Finish time: ____________  °Date: ____________ Movements: ____________ Start time: ____________ Finish time: ____________ °Date: ____________ Movements: ____________ Start time: ____________ Finish time: ____________ °Date: ____________ Movements: ____________ Start  time: ____________ Finish time: ____________ °Date: ____________ Movements: ____________ Start time: ____________ Finish time: ____________ °Date: ____________ Movements: ____________ Start time: ____________ Finish time: ____________ °Date: ____________ Movements: ____________ Start time: ____________ Finish time: ____________ °Date: ____________ Movements: ____________ Start time: ____________ Finish time: ____________  °Date:   ____________ Movements: ____________ Start time: ____________ Finish time: ____________ °Date: ____________ Movements: ____________ Start time: ____________ Finish time: ____________ °Date: ____________ Movements: ____________ Start time: ____________ Finish time: ____________ °Date: ____________ Movements: ____________ Start time: ____________ Finish time: ____________ °Date: ____________ Movements: ____________ Start time: ____________ Finish time: ____________ °Date: ____________ Movements: ____________ Start time: ____________ Finish time: ____________ °Date: ____________ Movements: ____________ Start time: ____________ Finish time: ____________  °Date: ____________ Movements: ____________ Start time: ____________ Finish time: ____________ °Date: ____________ Movements: ____________ Start time: ____________ Finish time: ____________ °Date: ____________ Movements: ____________ Start time: ____________ Finish time: ____________ °Date: ____________ Movements: ____________ Start time: ____________ Finish time: ____________ °Date: ____________ Movements: ____________ Start time: ____________ Finish time: ____________ °Date: ____________ Movements: ____________ Start time: ____________ Finish time: ____________ °Date: ____________ Movements: ____________ Start time: ____________ Finish time: ____________  °Date: ____________ Movements: ____________ Start time: ____________ Finish time: ____________ °Date: ____________ Movements: ____________ Start time: ____________ Finish time:  ____________ °Date: ____________ Movements: ____________ Start time: ____________ Finish time: ____________ °Date: ____________ Movements: ____________ Start time: ____________ Finish time: ____________ °Date: ____________ Movements: ____________ Start time: ____________ Finish time: ____________ °Date: ____________ Movements: ____________ Start time: ____________ Finish time: ____________ °Document Released: 09/06/2006 Document Revised: 12/22/2013 Document Reviewed: 06/03/2012 °[employer]® Patient Information ©2015 [employer]. This information is not intended to replace advice given to you by your health care provider. Make sure you discuss any questions you have with your health care provider. ° °

## 2015-03-06 NOTE — MAU Provider Note (Signed)
History     CSN: [id number]  Arrival date and time: 03/06/15 1745   First Provider Initiated Contact with Patient 03/06/15 1805      Chief Complaint  Patient presents with  . Decreased Fetal Movement   HPI Ms. Kathleen Rich is a 24 y.o. G1P0 at [redacted]w[redacted]d who presents to MAU today with complaint of decreased fetal movement since yesterday morning. The patient has noted some movement, but significantly less than usual. She has had some N/V recently, but none today. She denies diarrhea, vaginal bleeding, discharge, LOF, contractions, UTI symptoms or complications with the pregnancy.  OB History    Gravida Para Term Preterm AB TAB SAB Ectopic Multiple Living   1 0        0      Past Medical History  Diagnosis Date  . Medical history non-contributory     Past Surgical History  Procedure Laterality Date  . Salpingoophorectomy    . Back surgery    . Ankle surgery      History reviewed. No pertinent family history.  History  Substance Use Topics  . Smoking status: Never Smoker   . Smokeless tobacco: Not on file  . Alcohol Use: No    Allergies:  Allergies  Allergen Reactions  . Dilaudid [Hydromorphone Hcl] Rash    Prescriptions prior to admission  Medication Sig Dispense Refill Last Dose  . Prenatal Vit-Fe Fumarate-FA (PRENATAL MULTIVITAMIN) TABS tablet Take 1 tablet by mouth at bedtime.   03/05/2015 at Unknown time  . HYDROcodone-acetaminophen (NORCO/VICODIN) 5-325 MG per tablet Take one tab po q 4-6 hrs prn pain (Patient not taking: Reported on 03/06/2015) 12 tablet 0 Taking  . traMADol (ULTRAM) 50 MG tablet Take 1 tablet (50 mg total) by mouth every 6 (six) hours as needed. (Patient not taking: Reported on 03/06/2015) 20 tablet 0   . [DISCONTINUED] ibuprofen (ADVIL,MOTRIN) 600 MG tablet Take 1 tablet (600 mg total) by mouth every 6 (six) hours as needed for pain. Take with food (Patient not taking: Reported on 03/06/2015) 21 tablet 0 Taking    Review of Systems   Constitutional: Negative for fever and malaise/fatigue.  Gastrointestinal: Positive for nausea and vomiting. Negative for abdominal pain, diarrhea and constipation.  Genitourinary: Negative for dysuria, urgency and frequency.       Neg - vaginal bleeding, discharge, LOF   Physical Exam   Blood pressure 120/62, pulse 89, temperature 99.4 F (37.4 C), temperature source Oral, resp. rate 16, height 5' (1.524 m), weight 135 lb (61.236 kg), last menstrual period 08/30/2014.  Physical Exam  Nursing note and vitals reviewed. Constitutional: She is oriented to person, place, and time. She appears well-developed and well-nourished. No distress.  HENT:  Head: Normocephalic and atraumatic.  Cardiovascular: Normal rate.   Respiratory: Effort normal.  GI: Soft. She exhibits no distension and no mass. There is no tenderness. There is no rebound and no guarding.  Neurological: She is alert and oriented to person, place, and time.  Skin: Skin is warm and dry. No erythema.  Psychiatric: She has a normal mood and affect.  Dilation: Closed Effacement (%): Thick Cervical Position: Posterior Exam by:: Vonzella Nipple  Results for orders placed or performed during the hospital encounter of 03/06/15 (from the past 24 hour(s))  Urinalysis, Routine w reflex microscopic (not at [facility])     Status: None   Collection Time: 03/06/15  6:00 PM  Result Value Ref Range   Color, Urine YELLOW YELLOW   APPearance CLEAR CLEAR  Specific Gravity, Urine 1.020 1.005 - 1.030   pH 6.5 5.0 - 8.0   Glucose, UA NEGATIVE NEGATIVE mg/dL   Hgb urine dipstick NEGATIVE NEGATIVE   Bilirubin Urine NEGATIVE NEGATIVE   Ketones, ur NEGATIVE NEGATIVE mg/dL   Protein, ur NEGATIVE NEGATIVE mg/dL   Urobilinogen, UA 0.2 0.0 - 1.0 mg/dL   Nitrite NEGATIVE NEGATIVE   Leukocytes, UA NEGATIVE NEGATIVE    Fetal Monitoring: Baseline: 140 bpm, moderate variability, + accelerations, no decelerations Contractions: q 2-3 minutes  MAU Course   Procedures None  MDM UA today FFN collected prior to cervical exam. FFN was not sent since cervix was closed and thick.  Discussed with Dr. Marcelle OverlieHolland. Recommends BPP today.  BPP 8/8, normal AFI Discussed results with Dr. Marcelle OverlieHolland. He would like us to send FFN. If negative patient may be discharged to follow-up in the office.  2006 - [facility] CN[facility]  Marny LowensteinJulie N Wenzel, PA-C  03/06/2015, 8:06 PM   Dorathy KinsmanVirginia Jamyah Folk, CNM assumed care of patient at 8:06 PM. Fetal fibronectin pending.  Fetal fiber neck and negative.  Assessment and Plan   1. Preterm uterine contractions in second trimester, antepartum   2. Decreased fetal movement    Discharge home in stable condition per consult with Dr. Marcelle OverlieHolland. Preterm labor precautions. Fetal kick counts. Follow-up Information    Follow up with Meriel PicaHOLLAND,RICHARD M, MD In 1 week.   Specialty:  Obstetrics and Gynecology   Why:  Routine prenatal visit or sooner as needed if symptoms worsen   Contact information:   [address] [place] [id number] [phone]       [facility] [place] [facility].   Why:  As needed in emergencies   Contact information:   [address] [id number] mc [place] [id number] [phone]        Medication List    ASK your doctor about these medications        HYDROcodone-acetaminophen 5-325 MG per tablet  Commonly known as:  NORCO/VICODIN  Take one tab po q 4-6 hrs prn pain     prenatal multivitamin Tabs tablet  Take 1 tablet by mouth at bedtime.     traMADol 50 MG tablet  Commonly known as:  ULTRAM  Take 1 tablet (50 mg total) by mouth every 6 (six) hours as needed.        [place] Kedra Mc[place], [place] 03/06/2015 9:17 PM

## 2015-03-07 DIAGNOSIS — Z3A28 28 weeks gestation of pregnancy: Secondary | ICD-10-CM | POA: Insufficient documentation

## 2015-03-07 DIAGNOSIS — O368190 Decreased fetal movements, unspecified trimester, not applicable or unspecified: Secondary | ICD-10-CM | POA: Insufficient documentation

## 2015-03-20 ENCOUNTER — Encounter (HOSPITAL_COMMUNITY): Payer: Self-pay

## 2015-03-20 ENCOUNTER — Inpatient Hospital Stay (HOSPITAL_COMMUNITY): Payer: BLUE CROSS/BLUE SHIELD

## 2015-03-20 ENCOUNTER — Inpatient Hospital Stay (HOSPITAL_COMMUNITY)
Admission: AD | Admit: 2015-03-20 | Discharge: 2015-03-20 | Disposition: A | Discharge status: Home / Self Care | Payer: BLUE CROSS/BLUE SHIELD | Source: Ambulatory Visit | Attending: Obstetrics and Gynecology | Admitting: Obstetrics and Gynecology

## 2015-03-20 DIAGNOSIS — O368190 Decreased fetal movements, unspecified trimester, not applicable or unspecified: Secondary | ICD-10-CM | POA: Diagnosis not present

## 2015-03-20 DIAGNOSIS — O368130 Decreased fetal movements, third trimester, not applicable or unspecified: Secondary | ICD-10-CM | POA: Diagnosis not present

## 2015-03-20 DIAGNOSIS — R109 Unspecified abdominal pain: Secondary | ICD-10-CM

## 2015-03-20 DIAGNOSIS — Z3A30 30 weeks gestation of pregnancy: Secondary | ICD-10-CM | POA: Diagnosis not present

## 2015-03-20 DIAGNOSIS — O368390 Maternal care for abnormalities of the fetal heart rate or rhythm, unspecified trimester, not applicable or unspecified: Secondary | ICD-10-CM

## 2015-03-20 DIAGNOSIS — O4703 False labor before 37 completed weeks of gestation, third trimester: Secondary | ICD-10-CM | POA: Diagnosis not present

## 2015-03-20 DIAGNOSIS — M549 Dorsalgia, unspecified: Secondary | ICD-10-CM | POA: Diagnosis present

## 2015-03-20 DIAGNOSIS — O26899 Other specified pregnancy related conditions, unspecified trimester: Secondary | ICD-10-CM | POA: Insufficient documentation

## 2015-03-20 DIAGNOSIS — O4700 False labor before 37 completed weeks of gestation, unspecified trimester: Secondary | ICD-10-CM | POA: Insufficient documentation

## 2015-03-20 LAB — FETAL FIBRONECTIN: FETAL FIBRONECTIN: NEGATIVE

## 2015-03-20 LAB — URINALYSIS, ROUTINE W REFLEX MICROSCOPIC
Bilirubin Urine: NEGATIVE
Glucose, UA: NEGATIVE mg/dL
HGB URINE DIPSTICK: NEGATIVE
KETONES UR: NEGATIVE mg/dL
Leukocytes, UA: NEGATIVE
Nitrite: NEGATIVE
PROTEIN: NEGATIVE mg/dL
Specific Gravity, Urine: 1.025 (ref 1.005–1.030)
UROBILINOGEN UA: 0.2 mg/dL (ref 0.0–1.0)
pH: 6 (ref 5.0–8.0)

## 2015-03-20 MED ORDER — NIFEDIPINE 10 MG PO CAPS
10.0000 mg | ORAL_CAPSULE | Freq: Once | ORAL | Status: AC
  Administered 2015-03-20: 10 mg via ORAL
  Filled 2015-03-20: qty 1

## 2015-03-20 MED ORDER — NIFEDIPINE 10 MG PO CAPS: 10.0000 mg | ORAL_CAPSULE | Freq: Four times a day (QID) | ORAL | Status: DC | PRN

## 2015-03-20 NOTE — Discharge Instructions (Signed)
Abdominal Pain During Pregnancy °Abdominal pain is common in pregnancy. Most of the time, it does not cause harm. There are many causes of abdominal pain. Some causes are more serious than others. Some of the causes of abdominal pain in pregnancy are easily diagnosed. Occasionally, the diagnosis takes time to understand. Other times, the cause is not determined. Abdominal pain can be a sign that something is very wrong with the pregnancy, or the pain may have nothing to do with the pregnancy at all. For this reason, always tell your health care provider if you have any abdominal discomfort. °HOME CARE INSTRUCTIONS  °Monitor your abdominal pain for any changes. The following actions may help to alleviate any discomfort you are experiencing: °· Do not have sexual intercourse or put anything in your vagina until your symptoms go away completely. °· Get plenty of rest until your pain improves. °· Drink clear fluids if you feel nauseous. Avoid solid food as long as you are uncomfortable or nauseous. °· Only take over-the-counter or prescription medicine as directed by your health care provider. °· Keep all follow-up appointments with your health care provider. °SEEK IMMEDIATE MEDICAL CARE IF: °· You are bleeding, leaking fluid, or passing tissue from the vagina. °· You have increasing pain or cramping. °· You have persistent vomiting. °· You have painful or bloody urination. °· You have a fever. °· You notice a decrease in your baby's movements. °· You have extreme weakness or feel faint. °· You have shortness of breath, with or without abdominal pain. °· You develop a severe headache with abdominal pain. °· You have abnormal vaginal discharge with abdominal pain. °· You have persistent diarrhea. °· You have abdominal pain that continues even after rest, or gets worse. °MAKE SURE YOU:  °· Understand these instructions. °· Will watch your condition. °· Will get help right away if you are not doing well or get  worse. °Document Released: 08/07/2005 Document Revised: 05/28/2013 Document Reviewed: 03/06/2013 °ExitCare® Patient Information ©2015 [employer]. This information is not intended to replace advice given to you by your health care provider. Make sure you discuss any questions you have with your health care provider. °Back Pain in Pregnancy °Back pain during pregnancy is common. It happens in about half of all pregnancies. It is important for you and your baby that you remain active during your pregnancy. If you feel that back pain is not allowing you to remain active or sleep well, it is time to see your caregiver. Back pain may be caused by several factors related to changes during your pregnancy. Fortunately, unless you had trouble with your back before your pregnancy, the pain is likely to get better after you deliver. °Low back pain usually occurs between the fifth and seventh months of pregnancy. It can, however, happen in the first couple months. Factors that increase the risk of back problems include:  °· Previous back problems. °· Injury to your back. °· Having twins or multiple births. °· A chronic cough. °· Stress. °· Job-related repetitive motions. °· Muscle or spinal disease in the back. °· Family history of back problems, ruptured (herniated) discs, or osteoporosis. °· Depression, anxiety, and panic attacks. °CAUSES  °· When you are pregnant, your body produces a hormone called relaxin. This hormone makes the ligaments connecting the low back and pubic bones more flexible. This flexibility allows the baby to be delivered more easily. When your ligaments are loose, your muscles need to work harder to support your back. Soreness in your back can   come from tired muscles. Soreness can also come from back tissues that are irritated since they are receiving less support. °· As the baby grows, it puts pressure on the nerves and blood vessels in your pelvis. This can cause back pain. °· As the baby grows and  gets heavier during pregnancy, the uterus pushes the stomach muscles forward and changes your center of gravity. This makes your back muscles work harder to maintain good posture. °SYMPTOMS  °Lumbar pain during pregnancy °Lumbar pain during pregnancy usually occurs at or above the waist in the center of the back. There may be pain and numbness that radiates into your leg or foot. This is similar to low back pain experienced by non-pregnant women. It usually increases with sitting for long periods of time, standing, or repetitive lifting. Tenderness may also be present in the muscles along your upper back. °Posterior pelvic pain during pregnancy °Pain in the back of the pelvis is more common than lumbar pain in pregnancy. It is a deep pain felt in your side at the waistline, or across the tailbone (sacrum), or in both places. You may have pain on one or both sides. This pain can also go into the buttocks and backs of the upper thighs. Pubic and groin pain may also be present. The pain does not quickly resolve with rest, and morning stiffness may also be present. °Pelvic pain during pregnancy can be brought on by most activities. A high level of fitness before and during pregnancy may or may not prevent this problem. Labor pain is usually 1 to 2 minutes apart, lasts for about 1 minute, and involves a bearing down feeling or pressure in your pelvis. However, if you are at term with the pregnancy, constant low back pain can be the beginning of early labor, and you should be aware of this. °DIAGNOSIS  °X-rays of the back should not be done during the first 12 to 14 weeks of the pregnancy and only when absolutely necessary during the rest of the pregnancy. MRIs do not give off radiation and are safe during pregnancy. MRIs also should only be done when absolutely necessary. °HOME CARE INSTRUCTIONS °· Exercise as directed by your caregiver. Exercise is the most effective way to prevent or manage back pain. If you have a  back problem, it is especially important to avoid sports that require sudden body movements. Swimming and walking are great activities. °· Do not stand in one place for long periods of time. °· Do not wear high heels. °· Sit in chairs with good posture. Use a pillow on your lower back if necessary. Make sure your head rests over your shoulders and is not hanging forward. °· Try sleeping on your side, preferably the left side, with a pillow or two between your legs. If you are sore after a night's rest, your bed may be too soft. Try placing a board between your mattress and box spring. °· Listen to your body when lifting. If you are experiencing pain, ask for help or try bending your knees more so you can use your leg muscles rather than your back muscles. Squat down when picking up something from the floor. Do not bend over. °· Eat a healthy diet. Try to gain weight within your caregiver's recommendations. °· Use heat or cold packs 3 to 4 times a day for 15 minutes to help with the pain. °· Only take over-the-counter or prescription medicines for pain, discomfort, or fever as directed by your caregiver. °Sudden (acute) back pain °· Use   bed rest for only the most extreme, acute episodes of back pain. Prolonged bed rest over 48 hours will aggravate your condition. °· Ice is very effective for acute conditions. °¨ Put ice in a plastic bag. °¨ Place a towel between your skin and the bag. °¨ Leave the ice on for 10 to 20 minutes every 2 hours, or as needed. °· Using heat packs for 30 minutes prior to activities is also helpful. °Continued back pain °See your caregiver if you have continued problems. Your caregiver can help or refer you for appropriate physical therapy. With conditioning, most back problems can be avoided. Sometimes, a more serious issue may be the cause of back pain. You should be seen right away if new problems seem to be developing. Your caregiver may recommend: °· A maternity girdle. °· An elastic  sling. °· A back brace. °· A massage therapist or acupuncture. °SEEK MEDICAL CARE IF:  °· You are not able to do most of your daily activities, even when taking the pain medicine you were given. °· You need a referral to a physical therapist or chiropractor. °· You want to try acupuncture. °SEEK IMMEDIATE MEDICAL CARE IF: °· You develop numbness, tingling, weakness, or problems with the use of your arms or legs. °· You develop severe back pain that is no longer relieved with medicines. °· You have a sudden change in bowel or bladder control. °· You have increasing pain in other areas of the body. °· You develop shortness of breath, dizziness, or fainting. °· You develop nausea, vomiting, or sweating. °· You have back pain which is similar to labor pains. °· You have back pain along with your water breaking or vaginal bleeding. °· You have back pain or numbness that travels down your leg. °· Your back pain developed after you fell. °· You develop pain on one side of your back. You may have a kidney stone. °· You see blood in your urine. You may have a bladder infection or kidney stone. °· You have back pain with blisters. You may have shingles. °Back pain is fairly common during pregnancy but should not be accepted as just part of the process. Back pain should always be treated as soon as possible. This will make your pregnancy as pleasant as possible. °Document Released: 11/15/2005 Document Revised: 10/30/2011 Document Reviewed: 12/27/2010 °ExitCare® Patient Information ©2015 [employer]. This information is not intended to replace advice given to you by your health care provider. Make sure you discuss any questions you have with your health care provider. ° °

## 2015-03-20 NOTE — Progress Notes (Signed)
Sharen Counter CNM in to discuss d/c plan with pt. Written and verbal d/c instructions given and understanding voiced.

## 2015-03-20 NOTE — MAU Note (Signed)
Having lower back pain and pelvic pressure today. Was seen here few wks ago for ctxs but no current meds for ctx

## 2015-03-20 NOTE — MAU Provider Note (Signed)
Chief Complaint:  Back Pain   First Provider Initiated Contact with Patient 03/20/15 2013      HPI: Kathleen Rich is a 24 y.o. G1P0 at 32w3dwho presents to maternity admissions reporting constant back pain and lower pelvic pain with onset this morning at work and remaining consistent throughout the day.  She reports she has not had as much water to drink today as usual.  She is unable to urinate upon arrival to MAU.  She reports good fetal movement, denies LOF, vaginal bleeding, vaginal itching/burning, urinary symptoms, h/a, dizziness, n/v, or fever/chills.    Back Pain This is a new problem. The current episode started today. The problem occurs constantly. The problem is unchanged. The quality of the pain is described as aching. The pain does not radiate. The pain is moderate. The symptoms are aggravated by standing. Associated symptoms include abdominal pain and pelvic pain. Pertinent negatives include no chest pain, dysuria, fever or headaches.  Abdominal Pain This is a new problem. The current episode started today. The onset quality is sudden. The problem occurs constantly. The most recent episode lasted 12 hours. The problem has been unchanged. The pain is located in the LLQ and RLQ. The pain is moderate. Quality: pressure. The abdominal pain radiates to the back. Pertinent negatives include no constipation, dysuria, fever, frequency, headaches, nausea or vomiting. She has tried nothing for the symptoms.    Past Medical History: Past Medical History  Diagnosis Date  . Medical history non-contributory     Past obstetric history: OB History  Gravida Para Term Preterm AB SAB TAB Ectopic Multiple Living  1 0        0    # Outcome Date GA Lbr Len/2nd Weight Sex Delivery Anes PTL Lv  1 Current               Past Surgical History: Past Surgical History  Procedure Laterality Date  . Salpingoophorectomy    . Back surgery    . Ankle surgery      Family History: History reviewed. No  pertinent family history.  Social History: History  Substance Use Topics  . Smoking status: Never Smoker   . Smokeless tobacco: Not on file  . Alcohol Use: No    Allergies:  Allergies  Allergen Reactions  . Dilaudid [Hydromorphone Hcl] Rash    Meds:  No prescriptions prior to admission    Review of Systems  Constitutional: Negative for fever, chills and malaise/fatigue.  Eyes: Negative for blurred vision.  Respiratory: Negative for cough and shortness of breath.   Cardiovascular: Negative for chest pain.  Gastrointestinal: Positive for abdominal pain. Negative for heartburn, nausea, vomiting and constipation.  Genitourinary: Positive for pelvic pain. Negative for dysuria, urgency and frequency.  Musculoskeletal: Positive for back pain.  Neurological: Negative for dizziness and headaches.  Psychiatric/Behavioral: Negative for depression.    Physical Exam  Blood pressure 108/60, pulse 75, temperature 98.2 F (36.8 C), resp. rate 18, height 5' (1.524 m), weight 63.866 kg (140 lb 12.8 oz), last menstrual period 08/30/2014, SpO2 97 %.   GENERAL: Well-developed, well-nourished female in no acute distress.  EYES: normal sclera/conjunctiva; no lid-lag HENT: Atraumatic, normocephalic HEART: normal rate RESP: normal effort ABDOMEN: Soft, non-tender MUSCULOSKELETAL: Normal ROM EXTREMITIES: Nontender, no edema NEURO/PSYCH: Alert and oriented, appropriate affect  GU: Cervix 0/thick/high, posterior  Dilation: Closed Exam by:: Sharen Counter CNM  FHT:  Baseline 135 , moderate variability, accelerations present, variable decelerations occasionally, down to 90s-100s lasting 30 seconds Contractions: q  2-6 mins, irregular, mild to palpation   Labs: Results for orders placed or performed during the hospital encounter of 03/20/15 (from the past 24 hour(s))  Urinalysis, Routine w reflex microscopic (not at [facility])     Status: Abnormal   Collection Time: 03/20/15  7:40 PM   Result Value Ref Range   Color, Urine YELLOW YELLOW   APPearance HAZY (A) CLEAR   Specific Gravity, Urine 1.025 1.005 - 1.030   pH 6.0 5.0 - 8.0   Glucose, UA NEGATIVE NEGATIVE mg/dL   Hgb urine dipstick NEGATIVE NEGATIVE   Bilirubin Urine NEGATIVE NEGATIVE   Ketones, ur NEGATIVE NEGATIVE mg/dL   Protein, ur NEGATIVE NEGATIVE mg/dL   Urobilinogen, UA 0.2 0.0 - 1.0 mg/dL   Nitrite NEGATIVE NEGATIVE   Leukocytes, UA NEGATIVE NEGATIVE  Fetal fibronectin     Status: None   Collection Time: 03/20/15  8:13 PM  Result Value Ref Range   Fetal Fibronectin NEGATIVE NEGATIVE    Imaging:  Preliminary report with BPP 8/8, AFI normal, EFW 75%tile  ED Course Ordered and reviewed U/A, FHR monitor, FFN.  Consult Dr Henderson Cloud regarding variable deceleration x 2.  BPP and U/S for EFW/growth ordered.  Reviewed normal findings with Dr Henderson Cloud. Pt given Procardia 10 mg PO and reported improvement in symptoms after medication.    Assessment: 1. Threatened preterm labor, third trimester   2. Variable fetal heart rate decelerations, antepartum   3. Decreased fetal movement   4. Abdominal pain affecting pregnancy     Plan: Discharge home Reviewed normal BPP/US findings with pt and S/O PTL precautions and fetal kick counts Procardia 10 mg PO Q 6 hours PRN F/U in office next week      Follow-up Information    Follow up with TOMBLIN II,JAMES E, MD In 1 week.   Specialty:  Obstetrics and Gynecology   Contact information:   [address] [place] [address] [phone]       Follow up with [facility] [place] [facility].   Why:  As needed for emergencies   Contact information:   [address] [id number] mc [place] [address] [phone]       Medication List    STOP taking these medications        HYDROcodone-acetaminophen 5-325 MG per tablet  Commonly known as:  NORCO/VICODIN     traMADol 50 MG tablet  Commonly  known as:  ULTRAM      TAKE these medications        acetaminophen 325 MG tablet  Commonly known as:  TYLENOL  Take 650 mg by mouth every 6 (six) hours as needed for mild pain.     NIFEdipine 10 MG capsule  Commonly known as:  PROCARDIA  Take 1 capsule (10 mg total) by mouth every 6 (six) hours as needed.     prenatal multivitamin Tabs tablet  Take 1 tablet by mouth at bedtime.        Sharen Counter Certified Nurse-Midwife 03/21/2015 1:08 AM

## 2015-03-21 DIAGNOSIS — Z3A30 30 weeks gestation of pregnancy: Secondary | ICD-10-CM | POA: Insufficient documentation

## 2015-03-21 DIAGNOSIS — O4700 False labor before 37 completed weeks of gestation, unspecified trimester: Secondary | ICD-10-CM | POA: Insufficient documentation

## 2015-03-21 DIAGNOSIS — R109 Unspecified abdominal pain: Secondary | ICD-10-CM

## 2015-03-21 DIAGNOSIS — O368390 Maternal care for abnormalities of the fetal heart rate or rhythm, unspecified trimester, not applicable or unspecified: Secondary | ICD-10-CM | POA: Insufficient documentation

## 2015-03-21 DIAGNOSIS — O26899 Other specified pregnancy related conditions, unspecified trimester: Secondary | ICD-10-CM | POA: Insufficient documentation

## 2015-04-22 ENCOUNTER — Encounter (HOSPITAL_COMMUNITY): Payer: Self-pay | Admitting: *Deleted

## 2015-04-22 ENCOUNTER — Inpatient Hospital Stay (HOSPITAL_COMMUNITY)
Admission: AD | Admit: 2015-04-22 | Discharge: 2015-04-26 | DRG: 765 | Disposition: A | Discharge status: Home / Self Care | Payer: BLUE CROSS/BLUE SHIELD | Source: Ambulatory Visit | Attending: Obstetrics and Gynecology | Admitting: Obstetrics and Gynecology

## 2015-04-22 DIAGNOSIS — Z3A35 35 weeks gestation of pregnancy: Secondary | ICD-10-CM | POA: Diagnosis present

## 2015-04-22 DIAGNOSIS — O42913 Preterm premature rupture of membranes, unspecified as to length of time between rupture and onset of labor, third trimester: Secondary | ICD-10-CM | POA: Diagnosis present

## 2015-04-22 DIAGNOSIS — O8612 Endometritis following delivery: Secondary | ICD-10-CM | POA: Diagnosis not present

## 2015-04-22 DIAGNOSIS — O42919 Preterm premature rupture of membranes, unspecified as to length of time between rupture and onset of labor, unspecified trimester: Secondary | ICD-10-CM | POA: Diagnosis present

## 2015-04-22 DIAGNOSIS — N719 Inflammatory disease of uterus, unspecified: Secondary | ICD-10-CM | POA: Diagnosis not present

## 2015-04-22 DIAGNOSIS — Z98891 History of uterine scar from previous surgery: Secondary | ICD-10-CM

## 2015-04-22 HISTORY — DX: Headache, unspecified: R51.9

## 2015-04-22 HISTORY — DX: Unspecified abnormal cytological findings in specimens from vagina: R87.629

## 2015-04-22 HISTORY — DX: Headache: R51

## 2015-04-22 HISTORY — DX: Unspecified ovarian cyst, unspecified side: N83.209

## 2015-04-22 LAB — URINALYSIS, ROUTINE W REFLEX MICROSCOPIC
Bilirubin Urine: NEGATIVE
GLUCOSE, UA: NEGATIVE mg/dL
Ketones, ur: NEGATIVE mg/dL
Leukocytes, UA: NEGATIVE
Nitrite: NEGATIVE
PH: 6 (ref 5.0–8.0)
Protein, ur: NEGATIVE mg/dL
SPECIFIC GRAVITY, URINE: 1.01 (ref 1.005–1.030)
Urobilinogen, UA: 0.2 mg/dL (ref 0.0–1.0)

## 2015-04-22 LAB — CBC
HEMATOCRIT: 31.1 % — AB (ref 36.0–46.0)
Hemoglobin: 10.6 g/dL — ABNORMAL LOW (ref 12.0–15.0)
MCH: 32.5 pg (ref 26.0–34.0)
MCHC: 34.1 g/dL (ref 30.0–36.0)
MCV: 95.4 fL (ref 78.0–100.0)
Platelets: 164 10*3/uL (ref 150–400)
RBC: 3.26 MIL/uL — ABNORMAL LOW (ref 3.87–5.11)
RDW: 13.5 % (ref 11.5–15.5)
WBC: 12.1 10*3/uL — ABNORMAL HIGH (ref 4.0–10.5)

## 2015-04-22 LAB — AMNISURE RUPTURE OF MEMBRANE (ROM) NOT AT ARMC: AMNISURE: POSITIVE

## 2015-04-22 LAB — URINE MICROSCOPIC-ADD ON

## 2015-04-22 LAB — OB RESULTS CONSOLE GBS: GBS: NEGATIVE

## 2015-04-22 LAB — TYPE AND SCREEN
ABO/RH(D): O POS
ANTIBODY SCREEN: NEGATIVE

## 2015-04-22 LAB — GROUP B STREP BY PCR: Group B strep by PCR: NEGATIVE

## 2015-04-22 MED ORDER — ONDANSETRON HCL 4 MG/2ML IJ SOLN
4.0000 mg | Freq: Four times a day (QID) | INTRAMUSCULAR | Status: DC | PRN
  Administered 2015-04-24: 4 mg via INTRAVENOUS
  Filled 2015-04-22: qty 2

## 2015-04-22 MED ORDER — LACTATED RINGERS IV SOLN
INTRAVENOUS | Status: DC
  Administered 2015-04-22: 125 mL/h via INTRAVENOUS
  Administered 2015-04-23 – 2015-04-24 (×4): via INTRAVENOUS
  Administered 2015-04-24: 1000 mL via INTRAVENOUS

## 2015-04-22 MED ORDER — LACTATED RINGERS IV SOLN: 500.0000 mL | INTRAVENOUS | Status: DC | PRN

## 2015-04-22 MED ORDER — ACETAMINOPHEN 325 MG PO TABS
650.0000 mg | ORAL_TABLET | ORAL | Status: DC | PRN
  Administered 2015-04-23: 650 mg via ORAL
  Filled 2015-04-22: qty 2

## 2015-04-22 MED ORDER — FLEET ENEMA 7-19 GM/118ML RE ENEM: 1.0000 | ENEMA | RECTAL | Status: DC | PRN

## 2015-04-22 MED ORDER — OXYTOCIN 40 UNITS IN LACTATED RINGERS INFUSION - SIMPLE MED
1.0000 m[IU]/min | INTRAVENOUS | Status: DC
  Administered 2015-04-22: 2 m[IU]/min via INTRAVENOUS
  Administered 2015-04-23: 4 m[IU]/min via INTRAVENOUS
  Filled 2015-04-22: qty 1000

## 2015-04-22 MED ORDER — CITRIC ACID-SODIUM CITRATE 334-500 MG/5ML PO SOLN
30.0000 mL | ORAL | Status: DC | PRN
  Administered 2015-04-24: 30 mL via ORAL
  Filled 2015-04-22: qty 15

## 2015-04-22 MED ORDER — OXYTOCIN 40 UNITS IN LACTATED RINGERS INFUSION - SIMPLE MED
62.5000 mL/h | INTRAVENOUS | Status: DC
Start: 2015-04-22 — End: 2015-04-24

## 2015-04-22 MED ORDER — TERBUTALINE SULFATE 1 MG/ML IJ SOLN: 0.2500 mg | Freq: Once | INTRAMUSCULAR | Status: DC | PRN

## 2015-04-22 MED ORDER — LIDOCAINE HCL (PF) 1 % IJ SOLN
30.0000 mL | INTRAMUSCULAR | Status: DC | PRN
  Filled 2015-04-22: qty 30

## 2015-04-22 MED ORDER — OXYTOCIN BOLUS FROM INFUSION: 500.0000 mL | INTRAVENOUS | Status: DC

## 2015-04-22 NOTE — MAU Note (Signed)
Small amt of leaking this morning, not really had any since, some mild cramping.  No bleeding.no recent intercourse.  Feet very swollen, not a new problem.  Denies HA, visual changes or epigastric pain.

## 2015-04-22 NOTE — H&P (Signed)
Kathleen Rich is a 24 y.o. female presenting for PPROM.  Patient woke up this AM with LOF that had occurred while she was asleep.  She continued to leak minimally today.  Not feeling CTX.  Active FM and no VB.  Antepartum course complicated by history of back surgery in childhood; no hardware placed.  GBS unknown.  Maternal Medical History:  Reason for admission: Rupture of membranes.   Contractions: Frequency: rare.   Perceived severity is mild.    Fetal activity: Perceived fetal activity is normal.   Last perceived fetal movement was within the past hour.    Prenatal complications: no prenatal complications Prenatal Complications - Diabetes: none.    OB History    Gravida Para Term Preterm AB TAB SAB Ectopic Multiple Living   1 0        0     Past Medical History  Diagnosis Date  . Medical history non-contributory   . Headache   . Ovarian cyst   . Vaginal Pap smear, abnormal     ok since   Past Surgical History  Procedure Laterality Date  . Salpingoophorectomy    . Back surgery    . Ankle surgery     Family History: family history is not on file. Social History:  reports that she has never smoked. She does not have any smokeless tobacco history on file. She reports that she does not drink alcohol or use illicit drugs.   Prenatal Transfer Tool  Maternal Diabetes: No Genetic Screening: Normal Maternal Ultrasounds/Referrals: Normal Fetal Ultrasounds or other Referrals:  None Maternal Substance Abuse:  No Significant Maternal Medications:  None Significant Maternal Lab Results:  None Other Comments:  None  ROS  Dilation: Fingertip Effacement (%): 90 Station: -1 Exam by:: J Rasch NP Blood pressure 132/85, pulse 81, temperature 98.8 F (37.1 C), resp. rate 18, height 5' (1.524 m), weight 154 lb (69.854 kg), last menstrual period 08/30/2014. Maternal Exam:  Uterine Assessment: Contraction strength is mild.  Contraction frequency is rare.   Abdomen: Patient  reports no abdominal tenderness. Fundal height is c/w dates.   Estimated fetal weight is 5#8.       Physical Exam  Constitutional: She is oriented to person, place, and time. She appears well-developed and well-nourished.  GI: Soft. There is no rebound and no guarding.  Neurological: She is alert and oriented to person, place, and time.  Skin: Skin is warm and dry.  Psychiatric: She has a normal mood and affect. Her behavior is normal.    Prenatal labs: ABO, Rh:   Antibody:   Rubella:   RPR:    HBsAg:    HIV:    GBS:     Assessment/Plan: 24yo G1 at [redacted]w[redacted]d with PPROM -Will start pitocin -Epidural when ready -GBS pending   Kathleen Rich, Rene 04/22/2015, 9:49 PM

## 2015-04-22 NOTE — MAU Provider Note (Signed)
S:  Kathleen Rich is a 24 y.o. female G1P0 at [redacted]w[redacted]d presenting with leaking of fluid, possible leaking of fluid. She noticed leaking of fluid this morning when she woke up; she has continued to feel wetness all day.   O:  GENERAL: Well-developed, well-nourished female in no acute distress.  LUNGS: Effort normal SKIN: Warm, dry and without erythema PSYCH: Normal mood and affect  Filed Vitals:   04/22/15 1858  BP: 132/85  Pulse: 81  Temp:   Resp:    Results for orders placed or performed during the hospital encounter of 04/22/15 (from the past 24 hour(s))  Urinalysis, Routine w reflex microscopic (not at [facility]     Status: Abnormal   Collection Time: 04/22/15  6:40 PM  Result Value Ref Range   Color, Urine YELLOW YELLOW   APPearance CLEAR CLEAR   Specific Gravity, Urine 1.010 1.005 - 1.030   pH 6.0 5.0 - 8.0   Glucose, UA NEGATIVE NEGATIVE mg/dL   Hgb urine dipstick TRACE (A) NEGATIVE   Bilirubin Urine NEGATIVE NEGATIVE   Ketones, ur NEGATIVE NEGATIVE mg/dL   Protein, ur NEGATIVE NEGATIVE mg/dL   Urobilinogen, UA 0.2 0.0 - 1.0 mg/dL   Nitrite NEGATIVE NEGATIVE   Leukocytes, UA NEGATIVE NEGATIVE  Urine microscopic-add on     Status: Abnormal   Collection Time: 04/22/15  6:40 PM  Result Value Ref Range   Squamous Epithelial / LPF RARE RARE   WBC, UA 3-6 <3 WBC/hpf   RBC / HPF 3-6 <3 RBC/hpf   Bacteria, UA MANY (A) RARE  Amnisure rupture of membrane (rom)not at [facility]     Status: None   Collection Time: 04/22/15  7:40 PM  Result Value Ref Range   Amnisure ROM POSITIVE     Speculum exam: Vagina - Small-Moderate amount of thick brown particulate discharge with a small amount of fluid pooling in the vagina.  Cervix - no active bleeding  Bimanual exam: Cervix:  fingertip, anterior, 90% effaced  Crist Fat collected, amnisure collected  Chaperone present for exam.  MDM:  Crist Fat negative Amnisure pending Report given to Thressa Sheller CNM who resumes care of the patient and will  call Dr.Morris with results.   ASSESSMENT/PLAN:  PPROM Admit to labor and delivery   Armando Reichert, CNM 04/22/2015 8:15 PM

## 2015-04-22 NOTE — MAU Note (Signed)
Pt reported that when she woke up this morning her panties were wet. No leaking since. Denies pain or cramping. Good fetal movement reported

## 2015-04-23 LAB — RPR: RPR: NONREACTIVE

## 2015-04-23 LAB — ABO/RH: ABO/RH(D): O POS

## 2015-04-23 MED ORDER — OXYTOCIN 40 UNITS IN LACTATED RINGERS INFUSION - SIMPLE MED: 1.0000 m[IU]/min | INTRAVENOUS | Status: DC

## 2015-04-23 MED ORDER — SODIUM CHLORIDE 0.9 % IV SOLN
2.0000 g | INTRAVENOUS | Status: DC
  Administered 2015-04-23 – 2015-04-24 (×3): 2 g via INTRAVENOUS
  Filled 2015-04-23 (×8): qty 2000

## 2015-04-23 MED ORDER — DIPHENHYDRAMINE HCL 50 MG/ML IJ SOLN: 12.5000 mg | INTRAMUSCULAR | Status: DC | PRN

## 2015-04-23 MED ORDER — BUTORPHANOL TARTRATE 1 MG/ML IJ SOLN
1.0000 mg | Freq: Once | INTRAMUSCULAR | Status: AC
  Administered 2015-04-23: 1 mg via INTRAVENOUS
  Filled 2015-04-23: qty 1

## 2015-04-23 MED ORDER — PHENYLEPHRINE 40 MCG/ML (10ML) SYRINGE FOR IV PUSH (FOR BLOOD PRESSURE SUPPORT)
80.0000 ug | PREFILLED_SYRINGE | INTRAVENOUS | Status: DC | PRN
  Filled 2015-04-23: qty 20

## 2015-04-23 MED ORDER — FENTANYL 2.5 MCG/ML BUPIVACAINE 1/10 % EPIDURAL INFUSION (WH - ANES)
14.0000 mL/h | INTRAMUSCULAR | Status: DC | PRN
  Administered 2015-04-24 (×4): 14 mL/h via EPIDURAL
  Filled 2015-04-23 (×3): qty 125

## 2015-04-23 MED ORDER — PROMETHAZINE HCL 25 MG/ML IJ SOLN
12.5000 mg | Freq: Once | INTRAMUSCULAR | Status: AC
  Administered 2015-04-23: 12.5 mg via INTRAVENOUS
  Filled 2015-04-23: qty 1

## 2015-04-23 MED ORDER — EPHEDRINE 5 MG/ML INJ: 10.0000 mg | INTRAVENOUS | Status: DC | PRN

## 2015-04-23 MED ORDER — TERBUTALINE SULFATE 1 MG/ML IJ SOLN
0.2500 mg | Freq: Once | INTRAMUSCULAR | Status: DC | PRN
Start: 2015-04-23 — End: 2015-04-24

## 2015-04-23 NOTE — Progress Notes (Signed)
Patient ID: Kathleen Rich, female   DOB: 1991/05/10, 24 y.o.   MRN: [id number] Cervix 1 cm 75 % fhr cat one Continue pitocin risk discussed

## 2015-04-23 NOTE — Progress Notes (Signed)
Patient ID: Kathleen Rich, female   DOB: October 31, 1990, 24 y.o.   MRN: [id number] Starting having late decels with pitocin Discontinued pitocin now fhr cat one Cervix 1.5 and 75 % will restart pitocin

## 2015-04-23 NOTE — Progress Notes (Signed)
Patient ID: Kathleen Rich, female   DOB: Apr 06, 1991, 24 y.o.   MRN: [id number] fhr now cat one will restart pitocin and antibiotics

## 2015-04-24 ENCOUNTER — Encounter (HOSPITAL_COMMUNITY): Payer: Self-pay

## 2015-04-24 ENCOUNTER — Inpatient Hospital Stay (HOSPITAL_COMMUNITY): Payer: BLUE CROSS/BLUE SHIELD | Admitting: Anesthesiology

## 2015-04-24 ENCOUNTER — Encounter (HOSPITAL_COMMUNITY)
Admission: AD | Disposition: A | Discharge status: Home / Self Care | Payer: Self-pay | Source: Ambulatory Visit | Attending: Obstetrics & Gynecology

## 2015-04-24 DIAGNOSIS — Z98891 History of uterine scar from previous surgery: Secondary | ICD-10-CM

## 2015-04-24 SURGERY — Surgical Case
Anesthesia: Epidural

## 2015-04-24 MED ORDER — SODIUM CHLORIDE 0.9 % IJ SOLN: 3.0000 mL | INTRAMUSCULAR | Status: DC | PRN

## 2015-04-24 MED ORDER — GENTAMICIN SULFATE 40 MG/ML IJ SOLN
130.0000 mg | Freq: Three times a day (TID) | INTRAVENOUS | Status: DC
  Filled 2015-04-24 (×2): qty 3.25

## 2015-04-24 MED ORDER — OXYTOCIN 10 UNIT/ML IJ SOLN
40.0000 [IU] | INTRAVENOUS | Status: DC | PRN
  Administered 2015-04-24: 40 [IU] via INTRAVENOUS

## 2015-04-24 MED ORDER — LIDOCAINE HCL (PF) 1 % IJ SOLN
INTRAMUSCULAR | Status: DC | PRN
  Administered 2015-04-24: 2 mL via EPIDURAL
  Administered 2015-04-24: 5 mL via EPIDURAL
  Administered 2015-04-24: 3 mL via EPIDURAL

## 2015-04-24 MED ORDER — SIMETHICONE 80 MG PO CHEW: 80.0000 mg | CHEWABLE_TABLET | ORAL | Status: DC | PRN

## 2015-04-24 MED ORDER — LACTATED RINGERS IV SOLN: INTRAVENOUS | Status: DC

## 2015-04-24 MED ORDER — SODIUM CHLORIDE 0.9 % IV SOLN: 250.0000 mL | INTRAVENOUS | Status: DC

## 2015-04-24 MED ORDER — LIDOCAINE-EPINEPHRINE (PF) 2 %-1:200000 IJ SOLN
INTRAMUSCULAR | Status: AC
  Filled 2015-04-24: qty 20

## 2015-04-24 MED ORDER — IBUPROFEN 600 MG PO TABS: 600.0000 mg | ORAL_TABLET | Freq: Four times a day (QID) | ORAL | Status: DC | PRN

## 2015-04-24 MED ORDER — SENNOSIDES-DOCUSATE SODIUM 8.6-50 MG PO TABS
2.0000 | ORAL_TABLET | ORAL | Status: DC
  Administered 2015-04-25 (×2): 2 via ORAL
  Filled 2015-04-24 (×2): qty 2

## 2015-04-24 MED ORDER — NALBUPHINE HCL 10 MG/ML IJ SOLN
5.0000 mg | Freq: Once | INTRAMUSCULAR | Status: DC | PRN
  Filled 2015-04-24: qty 0.5

## 2015-04-24 MED ORDER — KETOROLAC TROMETHAMINE 30 MG/ML IJ SOLN
INTRAMUSCULAR | Status: AC
  Administered 2015-04-24: 30 mg via INTRAMUSCULAR
  Filled 2015-04-24: qty 1

## 2015-04-24 MED ORDER — ONDANSETRON HCL 4 MG/2ML IJ SOLN
INTRAMUSCULAR | Status: DC | PRN
  Administered 2015-04-24: 4 mg via INTRAVENOUS

## 2015-04-24 MED ORDER — ACETAMINOPHEN 325 MG PO TABS
ORAL_TABLET | ORAL | Status: AC
  Filled 2015-04-24: qty 2

## 2015-04-24 MED ORDER — ONDANSETRON HCL 4 MG/2ML IJ SOLN
INTRAMUSCULAR | Status: AC
  Filled 2015-04-24: qty 2

## 2015-04-24 MED ORDER — OXYTOCIN 10 UNIT/ML IJ SOLN
INTRAMUSCULAR | Status: DC | PRN
  Administered 2015-04-24: 10 [IU]

## 2015-04-24 MED ORDER — MEPERIDINE HCL 25 MG/ML IJ SOLN: 6.2500 mg | INTRAMUSCULAR | Status: DC | PRN

## 2015-04-24 MED ORDER — METHYLERGONOVINE MALEATE 0.2 MG/ML IJ SOLN
INTRAMUSCULAR | Status: AC
  Filled 2015-04-24: qty 1

## 2015-04-24 MED ORDER — NALBUPHINE HCL 10 MG/ML IJ SOLN
5.0000 mg | INTRAMUSCULAR | Status: DC | PRN
  Filled 2015-04-24: qty 0.5

## 2015-04-24 MED ORDER — LACTATED RINGERS IV SOLN
INTRAVENOUS | Status: DC | PRN
  Administered 2015-04-24 (×3): via INTRAVENOUS

## 2015-04-24 MED ORDER — OXYCODONE-ACETAMINOPHEN 5-325 MG PO TABS: 1.0000 | ORAL_TABLET | ORAL | Status: DC | PRN

## 2015-04-24 MED ORDER — OXYTOCIN 10 UNIT/ML IJ SOLN
INTRAMUSCULAR | Status: AC
  Filled 2015-04-24: qty 4

## 2015-04-24 MED ORDER — METHYLERGONOVINE MALEATE 0.2 MG/ML IJ SOLN
INTRAMUSCULAR | Status: DC | PRN
  Administered 2015-04-24: 0.2 mg via INTRAMUSCULAR

## 2015-04-24 MED ORDER — SODIUM BICARBONATE 8.4 % IV SOLN
INTRAVENOUS | Status: AC
  Filled 2015-04-24: qty 50

## 2015-04-24 MED ORDER — MENTHOL 3 MG MT LOZG: 1.0000 | LOZENGE | OROMUCOSAL | Status: DC | PRN

## 2015-04-24 MED ORDER — CEFAZOLIN SODIUM-DEXTROSE 2-3 GM-% IV SOLR
INTRAVENOUS | Status: AC
  Filled 2015-04-24: qty 50

## 2015-04-24 MED ORDER — OXYCODONE-ACETAMINOPHEN 5-325 MG PO TABS: 2.0000 | ORAL_TABLET | ORAL | Status: DC | PRN

## 2015-04-24 MED ORDER — TETANUS-DIPHTH-ACELL PERTUSSIS 5-2.5-18.5 LF-MCG/0.5 IM SUSP: 0.5000 mL | Freq: Once | INTRAMUSCULAR | Status: DC

## 2015-04-24 MED ORDER — FENTANYL CITRATE (PF) 100 MCG/2ML IJ SOLN
25.0000 ug | INTRAMUSCULAR | Status: DC | PRN
  Administered 2015-04-24: 25 ug via INTRAVENOUS

## 2015-04-24 MED ORDER — SODIUM BICARBONATE 8.4 % IV SOLN
INTRAVENOUS | Status: DC | PRN
  Administered 2015-04-24 (×3): 5 mL via EPIDURAL

## 2015-04-24 MED ORDER — NALOXONE HCL 1 MG/ML IJ SOLN
1.0000 ug/kg/h | INTRAVENOUS | Status: DC | PRN
  Filled 2015-04-24: qty 2

## 2015-04-24 MED ORDER — DIPHENHYDRAMINE HCL 25 MG PO CAPS
25.0000 mg | ORAL_CAPSULE | ORAL | Status: DC | PRN
  Filled 2015-04-24: qty 1

## 2015-04-24 MED ORDER — LANOLIN HYDROUS EX OINT: 1.0000 "application " | TOPICAL_OINTMENT | CUTANEOUS | Status: DC | PRN

## 2015-04-24 MED ORDER — SCOPOLAMINE 1 MG/3DAYS TD PT72
MEDICATED_PATCH | TRANSDERMAL | Status: DC | PRN
  Administered 2015-04-24: 1 via TRANSDERMAL

## 2015-04-24 MED ORDER — FENTANYL CITRATE (PF) 100 MCG/2ML IJ SOLN
INTRAMUSCULAR | Status: DC | PRN
  Administered 2015-04-24 (×2): 50 ug via INTRAVENOUS

## 2015-04-24 MED ORDER — GENTAMICIN SULFATE 40 MG/ML IJ SOLN
140.0000 mg | Freq: Once | INTRAMUSCULAR | Status: AC
  Administered 2015-04-24: 140 mg via INTRAVENOUS
  Filled 2015-04-24: qty 3.5

## 2015-04-24 MED ORDER — MORPHINE SULFATE (PF) 0.5 MG/ML IJ SOLN
INTRAMUSCULAR | Status: DC | PRN
  Administered 2015-04-24: 1 mg via INTRAVENOUS
  Administered 2015-04-24: 4 mg via EPIDURAL

## 2015-04-24 MED ORDER — DEXAMETHASONE SODIUM PHOSPHATE 10 MG/ML IJ SOLN
INTRAMUSCULAR | Status: AC
  Filled 2015-04-24: qty 1

## 2015-04-24 MED ORDER — SCOPOLAMINE 1 MG/3DAYS TD PT72
MEDICATED_PATCH | TRANSDERMAL | Status: AC
Start: 2015-04-24 — End: 2015-04-24
  Filled 2015-04-24: qty 1

## 2015-04-24 MED ORDER — LACTATED RINGERS IV SOLN
INTRAVENOUS | Status: DC | PRN
  Administered 2015-04-24: 19:00:00 via INTRAVENOUS

## 2015-04-24 MED ORDER — WITCH HAZEL-GLYCERIN EX PADS: 1.0000 "application " | MEDICATED_PAD | CUTANEOUS | Status: DC | PRN

## 2015-04-24 MED ORDER — FENTANYL CITRATE (PF) 100 MCG/2ML IJ SOLN
INTRAMUSCULAR | Status: AC
  Filled 2015-04-24: qty 4

## 2015-04-24 MED ORDER — AMPICILLIN-SULBACTAM SODIUM 3 (2-1) G IJ SOLR
3.0000 g | Freq: Four times a day (QID) | INTRAMUSCULAR | Status: DC
  Administered 2015-04-25 – 2015-04-26 (×5): 3 g via INTRAVENOUS
  Filled 2015-04-24 (×9): qty 3

## 2015-04-24 MED ORDER — DIPHENHYDRAMINE HCL 25 MG PO CAPS
25.0000 mg | ORAL_CAPSULE | Freq: Four times a day (QID) | ORAL | Status: DC | PRN
  Administered 2015-04-25: 25 mg via ORAL

## 2015-04-24 MED ORDER — NALOXONE HCL 0.4 MG/ML IJ SOLN: 0.4000 mg | INTRAMUSCULAR | Status: DC | PRN

## 2015-04-24 MED ORDER — SODIUM CHLORIDE 0.9 % IJ SOLN
3.0000 mL | INTRAMUSCULAR | Status: DC | PRN
  Administered 2015-04-25: 3 mL via INTRAVENOUS
  Filled 2015-04-24: qty 3

## 2015-04-24 MED ORDER — OXYTOCIN 40 UNITS IN LACTATED RINGERS INFUSION - SIMPLE MED
62.5000 mL/h | INTRAVENOUS | Status: AC
  Administered 2015-04-24: 62.5 mL/h via INTRAVENOUS
  Filled 2015-04-24: qty 1000

## 2015-04-24 MED ORDER — FENTANYL CITRATE (PF) 100 MCG/2ML IJ SOLN
INTRAMUSCULAR | Status: AC
  Filled 2015-04-24: qty 2

## 2015-04-24 MED ORDER — MORPHINE SULFATE 0.5 MG/ML IJ SOLN
INTRAMUSCULAR | Status: AC
  Filled 2015-04-24: qty 100

## 2015-04-24 MED ORDER — KETOROLAC TROMETHAMINE 30 MG/ML IJ SOLN: 30.0000 mg | Freq: Four times a day (QID) | INTRAMUSCULAR | Status: AC | PRN

## 2015-04-24 MED ORDER — ONDANSETRON HCL 4 MG/2ML IJ SOLN: 4.0000 mg | Freq: Three times a day (TID) | INTRAMUSCULAR | Status: DC | PRN

## 2015-04-24 MED ORDER — IBUPROFEN 600 MG PO TABS
600.0000 mg | ORAL_TABLET | Freq: Four times a day (QID) | ORAL | Status: DC
  Administered 2015-04-25 – 2015-04-26 (×6): 600 mg via ORAL
  Filled 2015-04-24 (×6): qty 1

## 2015-04-24 MED ORDER — BISACODYL 10 MG RE SUPP: 10.0000 mg | Freq: Every day | RECTAL | Status: DC | PRN

## 2015-04-24 MED ORDER — KETOROLAC TROMETHAMINE 30 MG/ML IJ SOLN
30.0000 mg | Freq: Four times a day (QID) | INTRAMUSCULAR | Status: AC | PRN
  Administered 2015-04-24: 30 mg via INTRAMUSCULAR

## 2015-04-24 MED ORDER — SODIUM CHLORIDE 0.9 % IR SOLN
Status: DC | PRN
  Administered 2015-04-24: 1000 mL

## 2015-04-24 MED ORDER — SCOPOLAMINE 1 MG/3DAYS TD PT72
1.0000 | MEDICATED_PATCH | Freq: Once | TRANSDERMAL | Status: DC
  Filled 2015-04-24: qty 1

## 2015-04-24 MED ORDER — DIPHENHYDRAMINE HCL 50 MG/ML IJ SOLN: 12.5000 mg | INTRAMUSCULAR | Status: DC | PRN

## 2015-04-24 MED ORDER — PRENATAL MULTIVITAMIN CH
1.0000 | ORAL_TABLET | Freq: Every day | ORAL | Status: DC
  Administered 2015-04-25 – 2015-04-26 (×2): 1 via ORAL
  Filled 2015-04-24 (×2): qty 1

## 2015-04-24 MED ORDER — DIBUCAINE 1 % RE OINT: 1.0000 "application " | TOPICAL_OINTMENT | RECTAL | Status: DC | PRN

## 2015-04-24 MED ORDER — SIMETHICONE 80 MG PO CHEW
80.0000 mg | CHEWABLE_TABLET | Freq: Three times a day (TID) | ORAL | Status: DC
  Administered 2015-04-25 – 2015-04-26 (×4): 80 mg via ORAL
  Filled 2015-04-24 (×4): qty 1

## 2015-04-24 MED ORDER — OXYTOCIN 10 UNIT/ML IJ SOLN
INTRAMUSCULAR | Status: AC
  Filled 2015-04-24: qty 1

## 2015-04-24 MED ORDER — ZOLPIDEM TARTRATE 5 MG PO TABS: 5.0000 mg | ORAL_TABLET | Freq: Every evening | ORAL | Status: DC | PRN

## 2015-04-24 MED ORDER — ACETAMINOPHEN 325 MG PO TABS
650.0000 mg | ORAL_TABLET | ORAL | Status: DC | PRN
  Administered 2015-04-24: 650 mg via ORAL

## 2015-04-24 MED ORDER — CEFAZOLIN SODIUM-DEXTROSE 2-3 GM-% IV SOLR
INTRAVENOUS | Status: DC | PRN
  Administered 2015-04-24: 2 g via INTRAVENOUS

## 2015-04-24 MED ORDER — DEXAMETHASONE SODIUM PHOSPHATE 10 MG/ML IJ SOLN
INTRAMUSCULAR | Status: DC | PRN
  Administered 2015-04-24: 10 mg via INTRAVENOUS

## 2015-04-24 MED ORDER — AMPICILLIN-SULBACTAM SODIUM 3 (2-1) G IJ SOLR
3.0000 g | Freq: Once | INTRAMUSCULAR | Status: AC
  Administered 2015-04-24: 3 g via INTRAVENOUS
  Filled 2015-04-24: qty 3

## 2015-04-24 MED ORDER — FLEET ENEMA 7-19 GM/118ML RE ENEM: 1.0000 | ENEMA | Freq: Every day | RECTAL | Status: DC | PRN

## 2015-04-24 MED ORDER — SIMETHICONE 80 MG PO CHEW
80.0000 mg | CHEWABLE_TABLET | ORAL | Status: DC
  Administered 2015-04-25 (×2): 80 mg via ORAL
  Filled 2015-04-24 (×2): qty 1

## 2015-04-24 MED ORDER — SODIUM CHLORIDE 0.9 % IJ SOLN: 3.0000 mL | Freq: Two times a day (BID) | INTRAMUSCULAR | Status: DC

## 2015-04-24 SURGICAL SUPPLY — 32 items
CLAMP CORD UMBIL (MISCELLANEOUS) IMPLANT
CLOSURE WOUND 1/2 X4 (GAUZE/BANDAGES/DRESSINGS) ×1
CLOTH BEACON ORANGE TIMEOUT ST (SAFETY) ×3 IMPLANT
DRAPE SHEET LG 3/4 BI-LAMINATE (DRAPES) IMPLANT
DRSG OPSITE POSTOP 4X10 (GAUZE/BANDAGES/DRESSINGS) ×3 IMPLANT
DURAPREP 26ML APPLICATOR (WOUND CARE) ×3 IMPLANT
ELECT REM PT RETURN 9FT ADLT (ELECTROSURGICAL) ×3
ELECTRODE REM PT RTRN 9FT ADLT (ELECTROSURGICAL) ×1 IMPLANT
EXTRACTOR VACUUM M CUP 4 TUBE (SUCTIONS) IMPLANT
EXTRACTOR VACUUM M CUP 4' TUBE (SUCTIONS)
GLOVE BIO SURGEON STRL SZ7 (GLOVE) ×3 IMPLANT
GOWN STRL REUS W/TWL LRG LVL3 (GOWN DISPOSABLE) ×6 IMPLANT
KIT ABG SYR 3ML LUER SLIP (SYRINGE) ×3 IMPLANT
LIQUID BAND (GAUZE/BANDAGES/DRESSINGS) ×3 IMPLANT
NEEDLE HYPO 25X5/8 SAFETYGLIDE (NEEDLE) ×3 IMPLANT
NS IRRIG 1000ML POUR BTL (IV SOLUTION) ×3 IMPLANT
PACK C SECTION WH (CUSTOM PROCEDURE TRAY) ×3 IMPLANT
PAD ABD 7.5X8 STRL (GAUZE/BANDAGES/DRESSINGS) ×3 IMPLANT
PAD OB MATERNITY 4.3X12.25 (PERSONAL CARE ITEMS) ×3 IMPLANT
PENCIL SMOKE EVAC W/HOLSTER (ELECTROSURGICAL) ×3 IMPLANT
SPONGE GAUZE 4X4 12PLY STER LF (GAUZE/BANDAGES/DRESSINGS) ×6 IMPLANT
SPONGE LAP 18X18 X RAY DECT (DISPOSABLE) ×6 IMPLANT
STRIP CLOSURE SKIN 1/2X4 (GAUZE/BANDAGES/DRESSINGS) ×2 IMPLANT
SUT CHROMIC 0 CTX 36 (SUTURE) ×15 IMPLANT
SUT MON AB 4-0 PS1 27 (SUTURE) ×3 IMPLANT
SUT PDS AB 0 CT 36 (SUTURE) ×3 IMPLANT
SUT PLAIN 0 NONE (SUTURE) IMPLANT
SUT PLAIN 2 0 XLH (SUTURE) ×3 IMPLANT
SUT VIC AB 3-0 CT1 27 (SUTURE) ×2
SUT VIC AB 3-0 CT1 TAPERPNT 27 (SUTURE) ×1 IMPLANT
TOWEL OR 17X24 6PK STRL BLUE (TOWEL DISPOSABLE) ×3 IMPLANT
TRAY FOLEY CATH SILVER 14FR (SET/KITS/TRAYS/PACK) ×3 IMPLANT

## 2015-04-24 NOTE — Progress Notes (Signed)
ANTIBIOTIC CONSULT NOTE - INITIAL  Pharmacy Consult for Gentamicin Indication: Triple I  Allergies  Allergen Reactions  . Dilaudid [Hydromorphone Hcl] Rash    Patient Measurements: Height: 5' (152.4 cm) Weight: 154 lb (69.854 kg) IBW/kg (Calculated) : 45.5 Adjusted Body Weight: 52.9kg  Vital Signs: Temp: 100.5 F (38.1 C) (09/03 1146) Temp Source: Axillary (09/03 1146) BP: 118/56 mmHg (09/03 1201) Pulse Rate: 89 (09/03 1201)  Labs:  Recent Labs  04/22/15 2035  WBC 12.1*  HGB 10.6*  PLT 164   No results for input(s): GENTTROUGH, GENTPEAK, GENTRANDOM in the last 72 hours.   Microbiology: Recent Results (from the past 720 hour(s))  OB RESULT CONSOLE Group B Strep     Status: None   Collection Time: 04/22/15 12:00 AM  Result Value Ref Range Status   GBS Negative  Final  Group B strep by PCR     Status: None   Collection Time: 04/22/15  9:10 PM  Result Value Ref Range Status   Group B strep by PCR NEGATIVE NEGATIVE Final    Medications:  Ampicillin 2 grams every 6 hours started on 9/3 at 0000.  Assessment: 24 y.o. female G1P0 at [redacted]w[redacted]d with PROM, now about 48 hours agos and Tmax 100.5 Estimated Ke = 0.264, Vd = 0.4L/kg  Goal of Therapy:  Gentamicin peak 6-8 mg/L and Trough < 1 mg/L  Plan:  Gentamicin 140 mg IV x 1  Gentamicin 130 mg IV every 8 hrs  Check Scr with next labs if gentamicin continued > 24 hours PP. Will check gentamicin levels if continued > 72hr or clinically indicated.  Berlin Hun D 04/24/2015,12:30 PM

## 2015-04-24 NOTE — Progress Notes (Signed)
Patient ID: Kathleen Rich, female   DOB: 1991-05-07, 24 y.o.   MRN: [id number] On 9 mu of pitocin Cervix 6 cm and 100% fhr cat one Continue with pitocin

## 2015-04-24 NOTE — Anesthesia Preprocedure Evaluation (Addendum)
Anesthesia Evaluation  Patient identified by MRN, date of birth, ID band Patient awake    Reviewed: Allergy & Precautions, NPO status , Patient's Chart, lab work & pertinent test results  History of Anesthesia Complications Negative for: history of anesthetic complications  Airway Mallampati: III  TM Distance: >3 FB Neck ROM: Full    Dental  (+) Teeth Intact, Dental Advisory Given   Pulmonary neg pulmonary ROS,  breath sounds clear to auscultation  Pulmonary exam normal       Cardiovascular Exercise Tolerance: Good - anginanegative cardio ROS Normal cardiovascular examRhythm:Regular Rate:Normal     Neuro/Psych  Headaches, negative psych ROS   GI/Hepatic negative GI ROS, Neg liver ROS,   Endo/Other  obesity  Renal/GU negative Renal ROS     Musculoskeletal negative musculoskeletal ROS (+)   Abdominal   Peds  Hematology  (+) Blood dyscrasia, anemia ,   Anesthesia Other Findings Day of surgery medications reviewed with the patient.  Reproductive/Obstetrics (+) Pregnancy                            Anesthesia Physical Anesthesia Plan  ASA: II and emergent  Anesthesia Plan: Epidural   Post-op Pain Management:    Induction:   Airway Management Planned: Natural Airway  Additional Equipment:   Intra-op Plan:   Post-operative Plan:   Informed Consent: I have reviewed the patients History and Physical, chart, labs and discussed the procedure including the risks, benefits and alternatives for the proposed anesthesia with the patient or authorized representative who has indicated his/her understanding and acceptance.   Dental advisory given  Plan Discussed with: Anesthesiologist, CRNA and Surgeon  Anesthesia Plan Comments: (Patient identified. Risks/Benefits/Options discussed with patient including but not limited to bleeding, infection, nerve damage, paralysis, failed block, incomplete  pain control, headache, blood pressure changes, nausea, vomiting, reactions to medication both or allergic, itching and postpartum back pain. Confirmed with bedside nurse the patient's most recent platelet count. Confirmed with patient that they are not currently taking any anticoagulation, have any bleeding history or any family history of bleeding disorders. Patient expressed understanding and wished to proceed. All questions were answered.    Patient for C/section for failure to progress. Will use epidural for C/Section. M. Malen Gauze, MD)       Anesthesia Quick Evaluation

## 2015-04-24 NOTE — Progress Notes (Signed)
Patient ID: Kathleen Rich, female   DOB: 09/10/1990, 24 y.o.   MRN: [id number] Still 6 cm with increased swelling   vtx still -2 no decent Adequate ctx by iupc Precede with cesarean section Risk of cesarean section discussed.  These include:  Risk of infection;  Risk of hemorrhage that could require transfusions with the associated risk of aids or hepatitis;  Excessive bleeding could require hysterectomy;  Risk of injury to adjacent organs including bladder, bowel or ureters;  Risk of DVT's and possible pulmonary embolus.  Patient expresses a understanding of indications and risks.;

## 2015-04-24 NOTE — Anesthesia Procedure Notes (Signed)
Epidural Patient location during procedure: OB  Staffing Anesthesiologist: TURK, STEPHEN EDWARD Performed by: anesthesiologist   Preanesthetic Checklist Completed: patient identified, pre-op evaluation, timeout performed, IV checked, risks and benefits discussed and monitors and equipment checked  Epidural Patient position: sitting Prep: DuraPrep Patient monitoring: blood pressure and continuous pulse ox Approach: midline Location: L3-L4 Injection technique: LOR air  Needle:  Needle type: Tuohy  Needle gauge: 17 G Needle length: 9 cm Needle insertion depth: 4.5 cm Catheter size: 19 Gauge Catheter at skin depth: 9.5 cm Test dose: negative and Other (1% Lidocaine)  Additional Notes Patient identified.  Risk benefits discussed including failed block, incomplete pain control, headache, nerve damage, paralysis, blood pressure changes, nausea, vomiting, reactions to medication both toxic or allergic, and postpartum back pain.  Patient expressed understanding and wished to proceed.  All questions were answered.  Sterile technique used throughout procedure and epidural site dressed with sterile barrier dressing. No paresthesia or other complications noted. The patient did not experience any signs of intravascular injection such as tinnitus or metallic taste in mouth nor signs of intrathecal spread such as rapid motor block. Please see nursing notes for vital signs. Reason for block:procedure for pain   

## 2015-04-24 NOTE — Transfer of Care (Signed)
Immediate Anesthesia Transfer of Care Note  Patient: Kathleen Rich  Procedure(s) Performed: Procedure(s): CESAREAN SECTION (N/A)  Patient Location: PACU  Anesthesia Type:Epidural  Level of Consciousness: awake  Airway & Oxygen Therapy: Patient Spontanous Breathing and Patient connected to nasal cannula oxygen  Post-op Assessment: Report given to RN and Post -op Vital signs reviewed and stable  Post vital signs: stable  Last Vitals:  Filed Vitals:   04/24/15 2006  BP:   Pulse: 103  Temp: 38.6 C  Resp:     Complications: No apparent anesthesia complications

## 2015-04-24 NOTE — Anesthesia Postprocedure Evaluation (Signed)
  Anesthesia Post-op Note  Patient: Kathleen Rich  Procedure(s) Performed: Procedure(s): CESAREAN SECTION (N/A)  Patient Location: PACU  Anesthesia Type:Epidural  Level of Consciousness: awake, alert  and oriented  Airway and Oxygen Therapy: Patient Spontanous Breathing  Post-op Pain: none  Post-op Assessment: Post-op Vital signs reviewed, Patient's Cardiovascular Status Stable, Respiratory Function Stable, Patent Airway, No signs of Nausea or vomiting, Pain level controlled, No headache, No backache, Spinal receding and Patient able to bend at knees              Post-op Vital Signs: Reviewed and stable  Last Vitals:  Filed Vitals:   04/24/15 2015  BP: 133/78  Pulse: 90  Temp:   Resp: 19    Complications: No apparent anesthesia complications

## 2015-04-24 NOTE — Brief Op Note (Signed)
04/22/2015 - 04/24/2015  8:04 PM  PATIENT:  Kathleen Rich  24 y.o. female  PRE-OPERATIVE DIAGNOSIS:  Failure to Progress  POST-OPERATIVE DIAGNOSIS:  Failure to Progress  PROCEDURE:  Procedure(s): CESAREAN SECTION (N/A)  SURGEON:  Surgeon(s) and Role:    * Richardean Chimera, MD - Primary  PHYSICIAN ASSISTANT:   ASSISTANTS: none   ANESTHESIA:   epidural  EBL:  Total I/O In: 3800 [I.V.:3800] Out: 1150 [Urine:350; Blood:800]  BLOOD ADMINISTERED:none  DRAINS: Urinary Catheter (Foley)   LOCAL MEDICATIONS USED:  NONE  SPECIMEN:  Source of Specimen:  placenta  DISPOSITION OF SPECIMEN:  PATHOLOGY  COUNTS:  YES  TOURNIQUET:  * No tourniquets in log *  DICTATION: .Other Dictation: Dictation Number [id number]  PLAN OF CARE: Admit to inpatient   PATIENT DISPOSITION:  PACU - hemodynamically stable.   Delay start of Pharmacological VTE agent (>24hrs) due to surgical blood loss or risk of bleeding: no

## 2015-04-25 LAB — CBC
HCT: 24.6 % — ABNORMAL LOW (ref 36.0–46.0)
Hemoglobin: 8.3 g/dL — ABNORMAL LOW (ref 12.0–15.0)
MCH: 32.3 pg (ref 26.0–34.0)
MCHC: 33.7 g/dL (ref 30.0–36.0)
MCV: 95.7 fL (ref 78.0–100.0)
PLATELETS: 154 10*3/uL (ref 150–400)
RBC: 2.57 MIL/uL — ABNORMAL LOW (ref 3.87–5.11)
RDW: 13.7 % (ref 11.5–15.5)
WBC: 17.8 10*3/uL — ABNORMAL HIGH (ref 4.0–10.5)

## 2015-04-25 NOTE — Anesthesia Postprocedure Evaluation (Signed)
Anesthesia Post Note  Patient: Kathleen Rich  Procedure(s) Performed: Procedure(s) (LRB): CESAREAN SECTION (N/A)  Anesthesia type: Epidural  Patient location: Mother/Baby  Post pain: Pain level controlled  Post assessment: Post-op Vital signs reviewed  Last Vitals:  Filed Vitals:   04/25/15 0454  BP: 107/68  Pulse: 60  Temp: 36.9 C  Resp: 20    Post vital signs: Reviewed  Level of consciousness:alert  Complications: No apparent anesthesia complications

## 2015-04-25 NOTE — Op Note (Signed)
NAMEJULIAN, ASKIN                ACCOUNT NO.:  [id number]  MEDICAL RECORD NO.:  [id number]  LOCATION:  [facility]                          FACILITY:  [facility]  PHYSICIAN:  Juluis Mire, M.D.   DATE OF BIRTH:  February 28, 1991  DATE OF PROCEDURE:  04/24/2015 DATE OF DISCHARGE:                              OPERATIVE REPORT   PREOPERATIVE DIAGNOSIS:  Intrauterine pregnancy at 35 weeks and 3 days with failure to progress.  POSTOPERATIVE DIAGNOSIS:  Intrauterine pregnancy at 35 weeks and 3 days with failure to progress.  PROCEDURE:  Low-transverse cesarean section.  SURGEON:  Juluis Mire, M.D.  ANESTHESIA:  Epidural.  ESTIMATED BLOOD LOSS:  500 mL.  PACKS:  None.  DRAINS:  Included urethral Foley.  INTRAOPERATIVE BLOOD REPLACEMENT:  None.  COMPLICATIONS:  None.  INDICATIONS:  The patient is a 24 year old primigravida female at 35+ weeks presented with premature rupture of membranes.  She compressed with the use of Pitocin to approximately 6 cm to 7 cm of dilatation and arrest of dilatation despite adequate uterine contractions and the fetal vertex remained well out of the pelvis.  At this point in time, after about 3-4 hours of watching this, we decided to proceed with cesarean section.  The risks were explained as noted.  These include the risk of infection.  The risk of hemorrhage that could require transfusion with the risk of hepatitis.  Excessive bleeding could require hysterectomy, risk of injury to adjacent organs including bladder, bowel, ureters that could require further exploratory surgery.  Risk of deep venous thrombosis and pulmonary embolus.  The patient expressed understanding of indications and risks.  DESCRIPTION OF PROCEDURE:  The patient was taken to the OR and placed in supine position with left lateral tilt.  After satisfactory level of epidural anesthesia was obtained, the abdomen was prepped out with DuraPrep.  Then draped in sterile field.  A low-transverse  skin incision made with knife and carried through subcutaneous tissue.  Fascia was entered sharply.  Incision was fashioned laterally.  Fascia taken off the muscle superiorly and inferiorly.  Rectus muscles separated in the midline.  Perineum was entered sharply.  Incision of perineum extended both superiorly and inferiorly.  A low-transverse bladder flap was developed.  Low transverse uterine incision was begun with a knife extended laterally using manual traction.  Amniotic fluid was clear. The infant was delivered with elevation of head and fundal pressure. The infant was a viable female, Apgars were 7/8.  There was marked swelling on the right side of the infant's face and marked caput consistent with the cephalic pelvic disproportion.  Placenta was delivered manually.  Uterus was exteriorized for closure.  Uterus was closed in a running locking suture of 0 chromic using a 2-layer closure technique.  There was some bleeding from the left side of the incision brought to control with several figure-of-eights of 0 chromic.  At this point, we had good hemostasis.  Tubes and ovaries were unremarkable. Urine output remained clear.  Uterus returned to abdominal cavity.  We irrigated the pelvis, had good hemostasis.  At this time, perineum closed with the running sutures of the 3-0 Vicryl.  Fascia was closed with  running suture of 0 PDS.  Skin was closed in running subcuticular of 4-0 Monocryl and Steri-Strips.  Sponge, instrument, needle count was correct by the circulating nurse x3.  Foley catheter remained clear at the time of closure.  The patient tolerated the procedure well, was returned to recovery room in good condition.     Juluis Mire, M.D.     JSM/MEDQ  D:  04/24/2015  T:  04/24/2015  Job:  [id number]

## 2015-04-25 NOTE — Addendum Note (Signed)
Addendum  created 04/25/15 0825 by Lincoln Brigham, CRNA   Modules edited: Notes Section   Notes Section:  File: [id number]

## 2015-04-25 NOTE — Progress Notes (Signed)
Subjective: Postpartum Day one: Cesarean Delivery Patient reports tolerating PO.    Objective: Vital signs in last 24 hours: Temp:  [98.4 F (36.9 C)-101.4 F (38.6 C)] 98.4 F (36.9 C) (09/04 0454) Pulse Rate:  [60-103] 60 (09/04 0454) Resp:  [12-26] 20 (09/04 0454) BP: (98-145)/(50-87) 107/68 mmHg (09/04 0454) SpO2:  [87 %-95 %] 95 % (09/04 1610)  Physical Exam:  General: alert Lochia: appropriate Uterine Fundus: firm Incision: healing well DVT Evaluation: No evidence of DVT seen on physical exam.   Recent Labs  04/22/15 2035 04/25/15 0515  HGB 10.6* 8.3*  HCT 31.1* 24.6*    Assessment/Plan: Status post Cesarean section. Doing well postoperatively.  Continue current care.  Kathleen Rich S 04/25/2015, 8:57 AM

## 2015-04-25 NOTE — Lactation Note (Signed)
This note was copied from the chart of Kathleen Darrelle Barrell. Lactation Consultation Note Initial visit at 23 hours of age.  Baby is [redacted]w[redacted]d and 5#8oz.  Mom feels good about latches.  MBU RN set [place] DEBP and gave LPT parent education sheet.  Mom has pumped once with about in each bottle.  Parents were encouraged to supplement with formula earlier.  Discussed with parents using colostrum to supplement baby.  Encouraged mom to pump every 3 hours on preemie setting and to work on hand expression.  [facility] LC resources given and discussed.  Encouraged to feed with early cues on demand and wake baby as needed.   Early newborn behavior discussed.  Hand expression reported by mom with colostrum visible.  Mom to call for assist as needed.    Patient Name: Kathleen Rich [id number] Date: 04/25/2015 Reason for consult: Initial assessment;Infant < 6lbs;Late preterm infant   Maternal Data Has patient been taught Hand Expression?: Yes Does the patient have breastfeeding experience prior to this delivery?: No  Feeding Feeding Type: Breast Fed Length of feed: 10 min (on and off)  LATCH Score/Interventions Latch: Grasps breast easily, tongue down, lips flanged, rhythmical sucking. Intervention(s): Skin to skin Intervention(s): Assist with latch;Breast massage;Breast compression  Audible Swallowing: A few with stimulation Intervention(s): Skin to skin  Type of Nipple: Everted at rest and after stimulation  Comfort (Breast/Nipple): Soft / non-tender     Hold (Positioning): Assistance needed to correctly position infant at breast and maintain latch.  LATCH Score: 8  Lactation Tools Discussed/Used Pump Review: Setup, frequency, and cleaning Initiated by:: Pleas Koch rn [phone] Date initiated:: 04/25/15   Consult Status Consult Status: Follow-up Date: 04/26/15 Follow-up type: In-patient    [name] 04/25/2015, 6:43 PM

## 2015-04-26 ENCOUNTER — Ambulatory Visit: Payer: Self-pay

## 2015-04-26 MED ORDER — IBUPROFEN 600 MG PO TABS: 600.0000 mg | ORAL_TABLET | Freq: Four times a day (QID) | ORAL | Status: DC | PRN

## 2015-04-26 MED ORDER — OXYCODONE-ACETAMINOPHEN 5-325 MG PO TABS: 1.0000 | ORAL_TABLET | ORAL | Status: DC | PRN

## 2015-04-26 MED ORDER — AMOXICILLIN-POT CLAVULANATE 500-125 MG PO TABS: 1.0000 | ORAL_TABLET | Freq: Three times a day (TID) | ORAL | Status: DC

## 2015-04-26 NOTE — Discharge Summary (Signed)
Obstetric Discharge Summary Reason for Admission: onset of labor Prenatal Procedures: none Intrapartum Procedures: cesarean: low cervical, transverse Postpartum Procedures: none Complications-Operative and Postpartum: endometritis HEMOGLOBIN  Date Value Ref Range Status  04/25/2015 8.3* 12.0 - 15.0 g/dL Final   HCT  Date Value Ref Range Status  04/25/2015 24.6* 36.0 - 46.0 % Final    Physical Exam:  General: alert Lochia: appropriate Uterine Fundus: firm Incision: healing well DVT Evaluation: No evidence of DVT seen on physical exam.  Discharge Diagnoses: Term Pregnancy-delivered, endometritis  Discharge Information: Date: 04/26/2015 Activity: pelvic rest Diet: routine Medications: PNV, Ibuprofen, Percocet and Augmentin Condition: stable Instructions: refer to practice specific booklet Discharge to: home Follow-up Information    Schedule an appointment as soon as possible for a visit in 7 days to follow up.      Newborn Data: Live born female  Birth Weight: 5 lb 8 oz (2495 g) APGAR: 7, 8  Home with mother.  Meriel Pica 04/26/2015, 8:39 AM

## 2015-04-26 NOTE — Lactation Note (Signed)
This note was copied from the chart of Kathleen Rich. Lactation Consultation Note  LPI 51 hours old sleeping in crib. Reviewed feeding plan w/ parents.  Mother recently pumped 10 ml of colostrum. Reminded parents to keep feeding plan to 30 min and undress w/ hat on for feedings since she has been sleepy.   Patient Name: Kathleen Rich [id number] Date: 04/26/2015 Reason for consult: Late preterm infant   Maternal Data    Feeding Feeding Type: Formula Length of feed: 10 min  LATCH Score/Interventions Latch: Repeated attempts needed to sustain latch, nipple held in mouth throughout feeding, stimulation needed to elicit sucking reflex. Intervention(s): Skin to skin Intervention(s): Assist with latch  Audible Swallowing: A few with stimulation Intervention(s): Skin to skin  Type of Nipple: Everted at rest and after stimulation Intervention(s): Double electric pump  Comfort (Breast/Nipple): Soft / non-tender     Hold (Positioning): No assistance needed to correctly position infant at breast.  LATCH Score: 8  Lactation Tools Discussed/Used     Consult Status Consult Status: Follow-up Date: 04/27/15 Follow-up type: In-patient    Dahlia Byes [facility] 04/26/2015, 10:11 PM

## 2015-04-27 ENCOUNTER — Encounter (HOSPITAL_COMMUNITY): Payer: Self-pay | Admitting: Obstetrics and Gynecology

## 2016-02-17 DIAGNOSIS — Z0000 Encounter for general adult medical examination without abnormal findings: Secondary | ICD-10-CM | POA: Diagnosis not present

## 2016-02-17 DIAGNOSIS — Z6824 Body mass index (BMI) 24.0-24.9, adult: Secondary | ICD-10-CM | POA: Diagnosis not present

## 2017-01-09 DIAGNOSIS — Z6823 Body mass index (BMI) 23.0-23.9, adult: Secondary | ICD-10-CM | POA: Diagnosis not present

## 2017-01-09 DIAGNOSIS — B084 Enteroviral vesicular stomatitis with exanthem: Secondary | ICD-10-CM | POA: Diagnosis not present

## 2017-03-21 DIAGNOSIS — Q71812 Congenital shortening of left upper limb: Secondary | ICD-10-CM | POA: Diagnosis not present

## 2017-03-21 DIAGNOSIS — M216X9 Other acquired deformities of unspecified foot: Secondary | ICD-10-CM | POA: Diagnosis not present

## 2017-03-21 DIAGNOSIS — M79672 Pain in left foot: Secondary | ICD-10-CM | POA: Diagnosis not present

## 2017-03-21 DIAGNOSIS — Q661 Congenital talipes calcaneovarus: Secondary | ICD-10-CM | POA: Diagnosis not present

## 2017-05-07 DIAGNOSIS — Q6689 Other  specified congenital deformities of feet: Secondary | ICD-10-CM | POA: Diagnosis not present

## 2017-05-07 DIAGNOSIS — M79672 Pain in left foot: Secondary | ICD-10-CM | POA: Diagnosis not present

## 2017-05-07 DIAGNOSIS — M21172 Varus deformity, not elsewhere classified, left ankle: Secondary | ICD-10-CM | POA: Diagnosis not present

## 2017-05-07 DIAGNOSIS — Q663 Other congenital varus deformities of feet: Secondary | ICD-10-CM | POA: Diagnosis not present

## 2017-05-07 DIAGNOSIS — M24573 Contracture, unspecified ankle: Secondary | ICD-10-CM | POA: Diagnosis not present

## 2018-01-03 DIAGNOSIS — Q6689 Other  specified congenital deformities of feet: Secondary | ICD-10-CM | POA: Diagnosis not present

## 2018-01-03 DIAGNOSIS — M79672 Pain in left foot: Secondary | ICD-10-CM | POA: Diagnosis not present

## 2018-01-03 DIAGNOSIS — M24573 Contracture, unspecified ankle: Secondary | ICD-10-CM | POA: Diagnosis not present

## 2018-01-03 DIAGNOSIS — Q663 Other congenital varus deformities of feet: Secondary | ICD-10-CM | POA: Diagnosis not present

## 2018-02-01 DIAGNOSIS — M2042 Other hammer toe(s) (acquired), left foot: Secondary | ICD-10-CM | POA: Diagnosis not present

## 2018-02-01 DIAGNOSIS — M79672 Pain in left foot: Secondary | ICD-10-CM | POA: Diagnosis not present

## 2018-02-01 DIAGNOSIS — Q663 Other congenital varus deformities of feet: Secondary | ICD-10-CM | POA: Diagnosis not present

## 2018-02-01 DIAGNOSIS — M21172 Varus deformity, not elsewhere classified, left ankle: Secondary | ICD-10-CM | POA: Diagnosis not present

## 2018-02-01 DIAGNOSIS — M24572 Contracture, left ankle: Secondary | ICD-10-CM | POA: Diagnosis not present

## 2018-02-01 DIAGNOSIS — Q6689 Other  specified congenital deformities of feet: Secondary | ICD-10-CM | POA: Diagnosis not present

## 2018-02-11 DIAGNOSIS — Z4789 Encounter for other orthopedic aftercare: Secondary | ICD-10-CM | POA: Diagnosis not present

## 2018-02-27 DIAGNOSIS — Z4789 Encounter for other orthopedic aftercare: Secondary | ICD-10-CM | POA: Diagnosis not present

## 2018-03-11 DIAGNOSIS — Z4789 Encounter for other orthopedic aftercare: Secondary | ICD-10-CM | POA: Diagnosis not present

## 2018-03-11 DIAGNOSIS — Q6689 Other  specified congenital deformities of feet: Secondary | ICD-10-CM | POA: Diagnosis not present

## 2018-04-01 DIAGNOSIS — Z4789 Encounter for other orthopedic aftercare: Secondary | ICD-10-CM | POA: Diagnosis not present

## 2018-04-25 DIAGNOSIS — Z3401 Encounter for supervision of normal first pregnancy, first trimester: Secondary | ICD-10-CM | POA: Diagnosis not present

## 2018-05-06 DIAGNOSIS — O021 Missed abortion: Secondary | ICD-10-CM | POA: Diagnosis not present

## 2018-05-06 DIAGNOSIS — Z3A08 8 weeks gestation of pregnancy: Secondary | ICD-10-CM | POA: Diagnosis not present

## 2018-05-06 DIAGNOSIS — Z3687 Encounter for antenatal screening for uncertain dates: Secondary | ICD-10-CM | POA: Diagnosis not present

## 2018-05-06 DIAGNOSIS — O26841 Uterine size-date discrepancy, first trimester: Secondary | ICD-10-CM | POA: Diagnosis not present

## 2018-05-08 DIAGNOSIS — Z79899 Other long term (current) drug therapy: Secondary | ICD-10-CM | POA: Diagnosis not present

## 2018-05-08 DIAGNOSIS — Z886 Allergy status to analgesic agent status: Secondary | ICD-10-CM | POA: Diagnosis not present

## 2018-05-08 DIAGNOSIS — O021 Missed abortion: Secondary | ICD-10-CM | POA: Diagnosis not present

## 2018-05-09 DIAGNOSIS — Z886 Allergy status to analgesic agent status: Secondary | ICD-10-CM | POA: Diagnosis not present

## 2018-05-09 DIAGNOSIS — O021 Missed abortion: Secondary | ICD-10-CM | POA: Diagnosis not present

## 2018-05-09 DIAGNOSIS — Z79899 Other long term (current) drug therapy: Secondary | ICD-10-CM | POA: Diagnosis not present

## 2018-06-06 DIAGNOSIS — J209 Acute bronchitis, unspecified: Secondary | ICD-10-CM | POA: Diagnosis not present

## 2018-06-06 DIAGNOSIS — Z6824 Body mass index (BMI) 24.0-24.9, adult: Secondary | ICD-10-CM | POA: Diagnosis not present

## 2018-08-01 DIAGNOSIS — M545 Low back pain: Secondary | ICD-10-CM | POA: Diagnosis not present

## 2018-08-01 DIAGNOSIS — N920 Excessive and frequent menstruation with regular cycle: Secondary | ICD-10-CM | POA: Diagnosis not present

## 2018-08-01 DIAGNOSIS — B373 Candidiasis of vulva and vagina: Secondary | ICD-10-CM | POA: Diagnosis not present

## 2018-08-01 DIAGNOSIS — N3001 Acute cystitis with hematuria: Secondary | ICD-10-CM | POA: Diagnosis not present

## 2018-08-05 DIAGNOSIS — N939 Abnormal uterine and vaginal bleeding, unspecified: Secondary | ICD-10-CM | POA: Diagnosis not present

## 2018-08-05 DIAGNOSIS — Z6823 Body mass index (BMI) 23.0-23.9, adult: Secondary | ICD-10-CM | POA: Diagnosis not present

## 2018-08-05 DIAGNOSIS — N96 Recurrent pregnancy loss: Secondary | ICD-10-CM | POA: Diagnosis not present

## 2018-08-26 DIAGNOSIS — N96 Recurrent pregnancy loss: Secondary | ICD-10-CM | POA: Diagnosis not present

## 2018-09-04 DIAGNOSIS — N859 Noninflammatory disorder of uterus, unspecified: Secondary | ICD-10-CM | POA: Diagnosis not present

## 2018-09-04 DIAGNOSIS — N96 Recurrent pregnancy loss: Secondary | ICD-10-CM | POA: Diagnosis not present

## 2018-09-04 DIAGNOSIS — N840 Polyp of corpus uteri: Secondary | ICD-10-CM | POA: Diagnosis not present

## 2018-09-04 DIAGNOSIS — N856 Intrauterine synechiae: Secondary | ICD-10-CM | POA: Diagnosis not present

## 2018-09-04 DIAGNOSIS — Z886 Allergy status to analgesic agent status: Secondary | ICD-10-CM | POA: Diagnosis not present

## 2018-10-24 DIAGNOSIS — H9203 Otalgia, bilateral: Secondary | ICD-10-CM | POA: Diagnosis not present

## 2018-10-24 DIAGNOSIS — R509 Fever, unspecified: Secondary | ICD-10-CM | POA: Diagnosis not present

## 2018-10-24 DIAGNOSIS — J029 Acute pharyngitis, unspecified: Secondary | ICD-10-CM | POA: Diagnosis not present

## 2018-10-30 DIAGNOSIS — R6889 Other general symptoms and signs: Secondary | ICD-10-CM | POA: Diagnosis not present

## 2018-10-30 DIAGNOSIS — Z6824 Body mass index (BMI) 24.0-24.9, adult: Secondary | ICD-10-CM | POA: Diagnosis not present

## 2018-12-03 DIAGNOSIS — S39012A Strain of muscle, fascia and tendon of lower back, initial encounter: Secondary | ICD-10-CM | POA: Diagnosis not present

## 2018-12-03 DIAGNOSIS — Z6824 Body mass index (BMI) 24.0-24.9, adult: Secondary | ICD-10-CM | POA: Diagnosis not present

## 2018-12-24 DIAGNOSIS — N912 Amenorrhea, unspecified: Secondary | ICD-10-CM | POA: Diagnosis not present

## 2019-01-09 DIAGNOSIS — Z3A08 8 weeks gestation of pregnancy: Secondary | ICD-10-CM | POA: Diagnosis not present

## 2019-01-09 DIAGNOSIS — O26841 Uterine size-date discrepancy, first trimester: Secondary | ICD-10-CM | POA: Diagnosis not present

## 2019-01-09 DIAGNOSIS — O208 Other hemorrhage in early pregnancy: Secondary | ICD-10-CM | POA: Diagnosis not present

## 2019-01-09 DIAGNOSIS — Z3689 Encounter for other specified antenatal screening: Secondary | ICD-10-CM | POA: Diagnosis not present

## 2019-01-20 DIAGNOSIS — Z3A09 9 weeks gestation of pregnancy: Secondary | ICD-10-CM | POA: Diagnosis not present

## 2019-01-20 DIAGNOSIS — O26851 Spotting complicating pregnancy, first trimester: Secondary | ICD-10-CM | POA: Diagnosis not present

## 2019-01-20 DIAGNOSIS — Z3689 Encounter for other specified antenatal screening: Secondary | ICD-10-CM | POA: Diagnosis not present

## 2019-01-20 DIAGNOSIS — O209 Hemorrhage in early pregnancy, unspecified: Secondary | ICD-10-CM | POA: Diagnosis not present

## 2019-01-31 DIAGNOSIS — Z3689 Encounter for other specified antenatal screening: Secondary | ICD-10-CM | POA: Diagnosis not present

## 2019-01-31 DIAGNOSIS — Z3A11 11 weeks gestation of pregnancy: Secondary | ICD-10-CM | POA: Diagnosis not present

## 2019-01-31 DIAGNOSIS — Z3682 Encounter for antenatal screening for nuchal translucency: Secondary | ICD-10-CM | POA: Diagnosis not present

## 2019-02-28 DIAGNOSIS — Z3689 Encounter for other specified antenatal screening: Secondary | ICD-10-CM | POA: Diagnosis not present

## 2019-04-01 DIAGNOSIS — Z3A19 19 weeks gestation of pregnancy: Secondary | ICD-10-CM | POA: Diagnosis not present

## 2019-04-01 DIAGNOSIS — Z3689 Encounter for other specified antenatal screening: Secondary | ICD-10-CM | POA: Diagnosis not present

## 2019-05-15 DIAGNOSIS — R1011 Right upper quadrant pain: Secondary | ICD-10-CM | POA: Diagnosis not present

## 2019-05-15 DIAGNOSIS — K824 Cholesterolosis of gallbladder: Secondary | ICD-10-CM | POA: Diagnosis not present

## 2019-05-20 DIAGNOSIS — Z3689 Encounter for other specified antenatal screening: Secondary | ICD-10-CM | POA: Diagnosis not present

## 2019-05-20 DIAGNOSIS — Z23 Encounter for immunization: Secondary | ICD-10-CM | POA: Diagnosis not present

## 2019-07-23 DIAGNOSIS — Z3685 Encounter for antenatal screening for Streptococcus B: Secondary | ICD-10-CM | POA: Diagnosis not present

## 2019-08-06 DIAGNOSIS — Z01812 Encounter for preprocedural laboratory examination: Secondary | ICD-10-CM | POA: Diagnosis not present

## 2019-08-06 DIAGNOSIS — R0989 Other specified symptoms and signs involving the circulatory and respiratory systems: Secondary | ICD-10-CM | POA: Diagnosis not present

## 2019-08-06 DIAGNOSIS — R05 Cough: Secondary | ICD-10-CM | POA: Diagnosis not present

## 2019-08-06 DIAGNOSIS — O34219 Maternal care for unspecified type scar from previous cesarean delivery: Secondary | ICD-10-CM | POA: Diagnosis not present

## 2019-08-06 DIAGNOSIS — Z3A00 Weeks of gestation of pregnancy not specified: Secondary | ICD-10-CM | POA: Diagnosis not present

## 2019-08-07 DIAGNOSIS — Z01818 Encounter for other preprocedural examination: Secondary | ICD-10-CM | POA: Diagnosis not present

## 2019-08-08 DIAGNOSIS — O99824 Streptococcus B carrier state complicating childbirth: Secondary | ICD-10-CM | POA: Diagnosis not present

## 2019-08-08 DIAGNOSIS — D62 Acute posthemorrhagic anemia: Secondary | ICD-10-CM | POA: Diagnosis not present

## 2019-08-08 DIAGNOSIS — O34219 Maternal care for unspecified type scar from previous cesarean delivery: Secondary | ICD-10-CM | POA: Diagnosis not present

## 2019-08-08 DIAGNOSIS — O34211 Maternal care for low transverse scar from previous cesarean delivery: Secondary | ICD-10-CM | POA: Diagnosis not present

## 2019-08-08 DIAGNOSIS — Z3A39 39 weeks gestation of pregnancy: Secondary | ICD-10-CM | POA: Diagnosis not present

## 2019-08-08 DIAGNOSIS — O9081 Anemia of the puerperium: Secondary | ICD-10-CM | POA: Diagnosis not present

## 2020-06-30 DIAGNOSIS — G5602 Carpal tunnel syndrome, left upper limb: Secondary | ICD-10-CM | POA: Diagnosis not present

## 2020-06-30 DIAGNOSIS — L309 Dermatitis, unspecified: Secondary | ICD-10-CM | POA: Diagnosis not present

## 2020-06-30 DIAGNOSIS — Z6826 Body mass index (BMI) 26.0-26.9, adult: Secondary | ICD-10-CM | POA: Diagnosis not present

## 2023-10-27 ENCOUNTER — Emergency Department (HOSPITAL_COMMUNITY)
Admission: EM | Admit: 2023-10-27 | Discharge: 2023-10-28 | Disposition: A | Discharge status: Home / Self Care | Attending: Emergency Medicine | Admitting: Emergency Medicine

## 2023-10-27 ENCOUNTER — Emergency Department (HOSPITAL_COMMUNITY)

## 2023-10-27 ENCOUNTER — Other Ambulatory Visit: Payer: Self-pay

## 2023-10-27 ENCOUNTER — Encounter (HOSPITAL_COMMUNITY): Payer: Self-pay | Admitting: Emergency Medicine

## 2023-10-27 DIAGNOSIS — M79675 Pain in left toe(s): Secondary | ICD-10-CM | POA: Diagnosis present

## 2023-10-27 DIAGNOSIS — L03032 Cellulitis of left toe: Secondary | ICD-10-CM | POA: Diagnosis not present

## 2023-10-27 LAB — COMPREHENSIVE METABOLIC PANEL
ALT: 68 U/L — ABNORMAL HIGH (ref 0–44)
AST: 53 U/L — ABNORMAL HIGH (ref 15–41)
Albumin: 3.9 g/dL (ref 3.5–5.0)
Alkaline Phosphatase: 75 U/L (ref 38–126)
Anion gap: 13 (ref 5–15)
BUN: 12 mg/dL (ref 6–20)
CO2: 20 mmol/L — ABNORMAL LOW (ref 22–32)
Calcium: 9.1 mg/dL (ref 8.9–10.3)
Chloride: 105 mmol/L (ref 98–111)
Creatinine, Ser: 0.69 mg/dL (ref 0.44–1.00)
GFR, Estimated: >60 mL/min (ref >60)
Glucose, Bld: 96 mg/dL (ref 70–99)
Potassium: 3.6 mmol/L (ref 3.5–5.1)
Sodium: 138 mmol/L (ref 135–145)
Total Bilirubin: 0.9 mg/dL (ref 0.0–1.2)
Total Protein: 7.4 g/dL (ref 6.5–8.1)

## 2023-10-27 LAB — CBC WITH DIFFERENTIAL/PLATELET
Abs Immature Granulocytes: 0.04 10*3/uL (ref 0.00–0.07)
Basophils Absolute: 0 10*3/uL (ref 0.0–0.1)
Basophils Relative: 0 %
Eosinophils Absolute: 0.1 10*3/uL (ref 0.0–0.5)
Eosinophils Relative: 1 %
HCT: 37.1 % (ref 36.0–46.0)
Hemoglobin: 12.6 g/dL (ref 12.0–15.0)
Immature Granulocytes: 0 %
Lymphocytes Relative: 17 %
Lymphs Abs: 2.1 10*3/uL (ref 0.7–4.0)
MCH: 32 pg (ref 26.0–34.0)
MCHC: 34 g/dL (ref 30.0–36.0)
MCV: 94.2 fL (ref 80.0–100.0)
Monocytes Absolute: 1 10*3/uL (ref 0.1–1.0)
Monocytes Relative: 8 %
Neutro Abs: 9.6 10*3/uL — ABNORMAL HIGH (ref 1.7–7.7)
Neutrophils Relative %: 74 %
Platelets: 206 10*3/uL (ref 150–400)
RBC: 3.94 MIL/uL (ref 3.87–5.11)
RDW: 12.9 % (ref 11.5–15.5)
WBC: 12.9 10*3/uL — ABNORMAL HIGH (ref 4.0–10.5)
nRBC: 0 % (ref 0.0–0.2)

## 2023-10-27 LAB — I-STAT CG4 LACTIC ACID, ED: Lactic Acid, Venous: 0.4 mmol/L — ABNORMAL LOW (ref 0.5–1.9)

## 2023-10-27 NOTE — ED Triage Notes (Addendum)
 Patient reports infection to 3rd toe on left foot. Patient seen by PCP on Friday and prescribed abx - 4 doses taken. Patient reports no improvement in infection. Patient states this is a reoccurring issue for her.

## 2023-10-28 LAB — I-STAT CG4 LACTIC ACID, ED: Lactic Acid, Venous: 0.9 mmol/L (ref 0.5–1.9)

## 2023-10-28 MED ORDER — FENTANYL CITRATE PF 50 MCG/ML IJ SOSY
50.0000 ug | PREFILLED_SYRINGE | Freq: Once | INTRAMUSCULAR | Status: AC
  Administered 2023-10-28: 50 ug via INTRAVENOUS
  Filled 2023-10-28: qty 1

## 2023-10-28 MED ORDER — OXYCODONE HCL 5 MG PO TABS: 5.0000 mg | ORAL_TABLET | ORAL | 0 refills | Status: DC | PRN

## 2023-10-28 MED ORDER — CLINDAMYCIN PHOSPHATE 600 MG/50ML IV SOLN
600.0000 mg | Freq: Once | INTRAVENOUS | Status: AC
  Administered 2023-10-28: 600 mg via INTRAVENOUS
  Filled 2023-10-28: qty 50

## 2023-10-28 NOTE — Discharge Instructions (Addendum)
 You likely have cellulitis.  Continue your antibiotics.  No concerning findings on x-ray.  You were given an IV dose of antibiotic.  If you have rapid worsening of your infection such as streaking of redness up your foot and leg please return for evaluation because at that time you may need to be admitted and observed with IV antibiotics.  Otherwise please closely follow-up with your primary care provider to ensure that you are have improvement.

## 2023-10-28 NOTE — ED Provider Notes (Signed)
 [place] EMERGENCY DEPARTMENT AT [facility] Provider Note   CSN: [id number] Arrival date & time: 10/27/23  2113     History  Chief Complaint  Patient presents with   Foot Injury    Kathleen Rich is a 33 y.o. female.  33 year old female presents today for concern of pain and swelling to the left third toe.  This started Thursday.  She states she was at a water park on Thursday.  Denies any trauma.  She was seen by PCP on Friday and started on antibiotics.  She has so far had 4 doses of the antibiotic.  She states she has had this in the past that typically after 1 day of antibiotic she has significant improvement.  She states she has had low-grade fever of up to 100.0.  She is taking the clindamycin.  No other complaints.  The history is provided by the patient. No language interpreter was used.       Home Medications Prior to Admission medications   Medication Sig Start Date End Date Taking? Authorizing Provider  amoxicillin-clavulanate (AUGMENTIN) 500-125 MG per tablet Take 1 tablet (500 mg total) by mouth 3 (three) times daily. 04/26/15   Richarda Overlie, MD  ibuprofen (ADVIL,MOTRIN) 600 MG tablet Take 1 tablet (600 mg total) by mouth every 6 (six) hours as needed for mild pain or moderate pain. 04/26/15   Richarda Overlie, MD  oxyCODONE-acetaminophen (PERCOCET/ROXICET) 5-325 MG per tablet Take 1 tablet by mouth every 4 (four) hours as needed (for pain scale 4-7). 04/26/15   Richarda Overlie, MD  Prenatal Vit-Fe Fumarate-FA (PRENATAL MULTIVITAMIN) TABS tablet Take 1 tablet by mouth at bedtime.    [provider]      Allergies    Dilaudid [hydromorphone hcl]    Review of Systems   Review of Systems  Constitutional:  Positive for fever.  Skin:  Positive for wound.  All other systems reviewed and are negative.   Physical Exam Updated Vital Signs BP 114/65 (BP Location: Right Arm)   Pulse 80   Temp 98.2 F (36.8 C) (Oral)   Resp 18   Ht 5' (1.524 m)    Wt 59 kg   SpO2 100%   BMI 25.39 kg/m  Physical Exam Vitals and nursing note reviewed.  Constitutional:      General: She is not in acute distress.    Appearance: Normal appearance. She is not ill-appearing.  HENT:     Head: Normocephalic and atraumatic.     Nose: Nose normal.  Eyes:     Conjunctiva/sclera: Conjunctivae normal.  Cardiovascular:     Rate and Rhythm: Normal rate and regular rhythm.  Pulmonary:     Effort: Pulmonary effort is normal. No respiratory distress.  Musculoskeletal:        General: No deformity.  Skin:    Findings: No rash.     Comments: See attached image for description.  Neurovascularly intact in the left foot.  Neurological:     Mental Status: She is alert.     ED Results / Procedures / Treatments   Labs (all labs ordered are listed, but only abnormal results are displayed) Labs Reviewed  COMPREHENSIVE METABOLIC PANEL - Abnormal; Notable for the following components:      Result Value   CO2 20 (*)    AST 53 (*)    ALT 68 (*)    All other components within normal limits  CBC WITH DIFFERENTIAL/PLATELET - Abnormal; Notable for the following components:  WBC 12.9 (*)    Neutro Abs 9.6 (*)    All other components within normal limits  I-STAT CG4 LACTIC ACID, ED - Abnormal; Notable for the following components:   Lactic Acid, Venous 0.4 (*)    All other components within normal limits  I-STAT CG4 LACTIC ACID, ED    EKG None  Radiology DG Foot Complete Left Result Date: 10/27/2023 CLINICAL DATA:  Status post trauma. EXAM: LEFT FOOT - COMPLETE 3+ VIEW COMPARISON:  None Available. FINDINGS: There is no evidence of acute fracture or dislocation. A radiopaque fixation plate and screws are seen along the first left metatarsal. A single radiopaque surgical screw is present within the left calcaneus. Chronic and/or congenital deformities are seen involving the phalanges of the fourth and fifth left toes. There is no evidence of arthropathy or other  focal bone abnormality. Mild to moderate severity soft tissue swelling is seen along the dorsal aspect of the distal left foot. IMPRESSION: 1. No acute osseous abnormality. 2. Postsurgical changes of the first left metatarsal and left calcaneus. 3. Mild to moderate severity soft tissue swelling along the dorsal aspect of the distal left foot. Electronically Signed   By: Aram Candela M.D.   On: 10/27/2023 21:51    Procedures Procedures    Medications Ordered in ED Medications  fentaNYL (SUBLIMAZE) injection 50 mcg (has no administration in time range)  clindamycin (CLEOCIN) IVPB 600 mg (has no administration in time range)    ED Course/ Medical Decision Making/ A&P                                 Medical Decision Making Amount and/or Complexity of Data Reviewed Labs: ordered. Radiology: ordered.  Risk Prescription drug management.   Medical Decision Making / ED Course   This patient presents to the ED for concern of swelling and pain to toe, this involves an extensive number of treatment options, and is a complaint that carries with it a high risk of complications and morbidity.  The differential diagnosis includes infection, cellulitis, fracture  MDM: 33 year old female presents today for concern of swelling and pain to the left toe.  She has history of the same.  Denies any trauma.  She has been on antibiotics for the past 2 days without improvement.  She states she was at a water park recently right before this started.  She seems to be on the right antibiotic with clindamycin.  Maybe she needs more time as it has only been 2 days.  Picture attached above for description.  Mild leukocytosis.  Lactic acid, CMP without acute concerns.  Extremity with soft tissue swelling otherwise without fracture or other acute concern.  Will give dose of IV clindamycin.  No other acute concern.  Patient will be stable on reevaluation.  She is stable for discharge.  Strict return  precautions discussed.  Will give short course of pain medication.  She will continue clindamycin.  She will closely follow-up with PCP.  Lab Tests: -I ordered, reviewed, and interpreted labs.   The pertinent results include:   Labs Reviewed  COMPREHENSIVE METABOLIC PANEL - Abnormal; Notable for the following components:      Result Value   CO2 20 (*)    AST 53 (*)    ALT 68 (*)    All other components within normal limits  CBC WITH DIFFERENTIAL/PLATELET - Abnormal; Notable for the following components:   WBC 12.9 (*)  Neutro Abs 9.6 (*)    All other components within normal limits  I-STAT CG4 LACTIC ACID, ED - Abnormal; Notable for the following components:   Lactic Acid, Venous 0.4 (*)    All other components within normal limits  I-STAT CG4 LACTIC ACID, ED      EKG  EKG Interpretation Date/Time:    Ventricular Rate:    PR Interval:    QRS Duration:    QT Interval:    QTC Calculation:   R Axis:      Text Interpretation:           Imaging Studies ordered: I ordered imaging studies including x-ray of the left foot I independently visualized and interpreted imaging. I agree with the radiologist interpretation   Medicines ordered and prescription drug management: Meds ordered this encounter  Medications   fentaNYL (SUBLIMAZE) injection 50 mcg   clindamycin (CLEOCIN) IVPB 600 mg    Antibiotic Indication::   Cellulitis    -I have reviewed the patients home medicines and have made adjustments as needed  Critical interventions IV antibiotics   Social Determinants of Health:  Factors impacting patients care include: Good family support   Reevaluation: After the interventions noted above, I reevaluated the patient and found that they have :stayed the same  Co morbidities that complicate the patient evaluation  Past Medical History:  Diagnosis Date   Headache    Medical history non-contributory    Ovarian cyst    Vaginal Pap smear, abnormal    ok  since      Dispostion: Discharged in stable condition.  Return precaution discussed.  Patient voices understanding and is in agreement with plan.     Final Clinical Impression(s) / ED Diagnoses Final diagnoses:  Cellulitis of toe of left foot    Rx / DC Orders ED Discharge Orders          Ordered    oxyCODONE (ROXICODONE) 5 MG immediate release tablet  Every 4 hours PRN        10/28/23 0956              Marita Kansas, PA-C 10/28/23 0957    Rexford Maus, DO 10/28/23 1059

## 2023-10-29 ENCOUNTER — Encounter: Payer: Self-pay | Admitting: Podiatry

## 2023-10-29 ENCOUNTER — Ambulatory Visit: Admitting: Podiatry

## 2023-10-29 VITALS — Ht 60.0 in | Wt 130.0 lb

## 2023-10-29 DIAGNOSIS — L02612 Cutaneous abscess of left foot: Secondary | ICD-10-CM

## 2023-10-29 DIAGNOSIS — L03032 Cellulitis of left toe: Secondary | ICD-10-CM | POA: Diagnosis not present

## 2023-10-29 MED ORDER — GENTAMICIN SULFATE 0.1 % EX OINT: 1.0000 | TOPICAL_OINTMENT | Freq: Every day | CUTANEOUS | 0 refills | Status: DC

## 2023-10-29 NOTE — Progress Notes (Unsigned)
  Subjective:  Patient ID: Kathleen Rich, female    DOB: 1991-08-07,  MRN: [id number]  Chief Complaint  Patient presents with   Toe Pain     NP - eval for cellulitis of toe, may need surgery, pt states she injured her left 3rd toe in July has been seeing different doctors for the issue was supposed to have procedure done in September but missed appointment, so she is starting over with a new doctor, was seen in the ED Saturday for this issue x-rays were done states today her child jump on her foot and now the toe is swollen and bleeding.     33 y.o. female presents with the above complaint. History confirmed with patient.  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 Abscess of third toe, left   2. Paronychia, toe, left      Plan:  Patient was evaluated and treated and all questions answered.  Discussed recurrent condition and I recommended removal of the nail plate to address the abscess under the nail.  She did not have any evidence of deep infection on her plain film radiographs, I did recommend an MRI to evaluate this as she says this has been on and off since last summer.   Following sterile prep with alcohol and consent the third toe was anesthetized with lidocaine and Marcaine.  He was prepped with Betadine a tourniquet secured around the base of the toe.  The toe was exsanguinated and the tourniquet was tightened and a Therapist, nutritional was used to avulse the nail plate.  He did not extend deep to bone and once the devitalized tissue was debrided there was healthy tissue remaining.  It was irrigated with alcohol and dressed with Silvadene and a bandage.  Return in 3 weeks to reevaluate.  MRI ordered stat and we will consider further options with her after. Return in about 3 weeks (around 11/19/2023) for after MRI to review, nail re-check.

## 2023-10-29 NOTE — Patient Instructions (Signed)

## 2023-10-31 ENCOUNTER — Ambulatory Visit
Admission: RE | Admit: 2023-10-31 | Discharge: 2023-10-31 | Discharge status: Home / Self Care | Source: Ambulatory Visit | Attending: Podiatry

## 2023-10-31 DIAGNOSIS — L02612 Cutaneous abscess of left foot: Secondary | ICD-10-CM

## 2023-11-01 ENCOUNTER — Telehealth: Payer: Self-pay | Admitting: Podiatry

## 2023-11-01 NOTE — Telephone Encounter (Signed)
 Patient called again asking about her MRI results she states is it shopwng she has a bone infection and she would like to know what to do.

## 2023-11-01 NOTE — Telephone Encounter (Signed)
 Reviewed results of MRI, will need partial 3rd toe amp left. Morrie Sheldon can you see if I can add her for partial 3rd toe amp (30 min on stretcher) for [facility] next Wednesday? Can do consent day of. If not can do on Friday at [facility]. [place] if Morrie Sheldon is not in sx scheduling tomorrow forward on if you can. Thanks!

## 2023-11-01 NOTE — Telephone Encounter (Signed)
 Patient stated she received MRI results on 10/31/2023 and would like for Dr. Lilian Kapur to inform her of what treatments are needed? Contact telephone number, [phone]

## 2023-11-02 ENCOUNTER — Telehealth: Payer: Self-pay | Admitting: Urology

## 2023-11-02 NOTE — Telephone Encounter (Signed)
 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  CALL REF # I - [id number]

## 2023-11-07 ENCOUNTER — Other Ambulatory Visit: Payer: Self-pay | Admitting: Podiatry

## 2023-11-07 DIAGNOSIS — M86472 Chronic osteomyelitis with draining sinus, left ankle and foot: Secondary | ICD-10-CM | POA: Diagnosis not present

## 2023-11-07 MED ORDER — CLINDAMYCIN HCL 300 MG PO CAPS
300.0000 mg | ORAL_CAPSULE | Freq: Three times a day (TID) | ORAL | 1 refills | Status: DC
Start: 2023-11-07 — End: 2023-11-27

## 2023-11-07 MED ORDER — IBUPROFEN 800 MG PO TABS: 800.0000 mg | ORAL_TABLET | Freq: Three times a day (TID) | ORAL | 0 refills | Status: AC | PRN

## 2023-11-07 MED ORDER — OXYCODONE HCL 5 MG PO TABS: 5.0000 mg | ORAL_TABLET | Freq: Four times a day (QID) | ORAL | 0 refills | Status: AC | PRN

## 2023-11-08 ENCOUNTER — Telehealth: Payer: Self-pay

## 2023-11-08 NOTE — Telephone Encounter (Signed)
 PA request received from covermymeds for Oxycodone HCl 5 mg tablet. PA submitted.  Kathleen Rich  (Key: [id number]) PA Case ID #: [id number] Rx #: [id number]

## 2023-11-12 DIAGNOSIS — M79673 Pain in unspecified foot: Secondary | ICD-10-CM

## 2023-11-12 NOTE — Telephone Encounter (Signed)
 Faxed [employer] forms for her hubby Molina Hollenback to take are of her while out. Faxed to [phone].

## 2023-11-13 ENCOUNTER — Ambulatory Visit (INDEPENDENT_AMBULATORY_CARE_PROVIDER_SITE_OTHER): Admitting: Podiatry

## 2023-11-13 ENCOUNTER — Encounter: Payer: Self-pay | Admitting: Podiatry

## 2023-11-13 DIAGNOSIS — L02612 Cutaneous abscess of left foot: Secondary | ICD-10-CM

## 2023-11-14 ENCOUNTER — Encounter: Payer: Self-pay | Admitting: Podiatry

## 2023-11-14 NOTE — Progress Notes (Signed)
  Subjective:  Patient ID: Kathleen Rich, female    DOB: 23-Mar-1991,  MRN: [id number]  Chief Complaint  Patient presents with   Routine Post Op    Patient states that she has had a little pain but no other complaints     DOS: 11/07/2023 Procedure: Partial left toe amputation  33 y.o. female returns for post-op check.   Review of Systems: Negative except as noted in the HPI. Denies N/V/F/Ch.   Objective:  There were no vitals filed for this visit. There is no height or weight on file to calculate BMI. Constitutional Well developed. Well nourished.  Vascular Foot warm and well perfused. Capillary refill normal to all digits.  Calf is soft and supple, no posterior calf or knee pain, negative Homans' sign  Neurologic Normal speech. Oriented to person, place, and time. Epicritic sensation to light touch grossly present bilaterally.  Dermatologic Skin healing well, skin edges well coapted without signs of dehiscence.  Some mild erythema still  Orthopedic: Tenderness to palpation noted about the surgical site.   Pathology showed no osteomyelitis at margin  Wound culture final growth negative  Assessment:   1. Abscess of third toe, left    Plan:  Patient was evaluated and treated and all questions answered.  S/p foot surgery left -Progressing as expected post-operatively.  Suspect the bone culture did not grow due to previous antibiotic therapy.  She should complete her clindamycin course.  Return in 2 weeks to have sutures removed continue weightbearing as tolerated in surgical shoe.  We discussed correction of the first and second digits eventually  Return in about 2 weeks (around 11/27/2023) for suture removal.

## 2023-11-20 ENCOUNTER — Ambulatory Visit: Admitting: Podiatry

## 2023-11-22 ENCOUNTER — Encounter: Payer: Self-pay | Admitting: Podiatry

## 2023-11-22 NOTE — Progress Notes (Signed)
 Patient called tonight stating that her toe is dark and discolored and she's concerned about blood flow of the toe. She also states that she has not been feeling well and she's been nauseous and tired. Given concern of circulation and infection I have recommended the patient go to the emergency room overnight for further evaluation. If needed, we can get her into the office tomorrow for evaluation.

## 2023-11-27 ENCOUNTER — Ambulatory Visit (INDEPENDENT_AMBULATORY_CARE_PROVIDER_SITE_OTHER): Admitting: Podiatry

## 2023-11-27 DIAGNOSIS — L02612 Cutaneous abscess of left foot: Secondary | ICD-10-CM

## 2023-11-30 NOTE — Progress Notes (Signed)
  Subjective:  Patient ID: Kathleen Rich, female    DOB: 1991-04-07,  MRN: [id number]  Chief Complaint  Patient presents with   POV #3    Patient is very anxious about having sutures removed. She did notice some change in color about a week ago. Some pain on edge closer to 2nd toe.     DOS: 11/07/2023 Procedure: Partial left toe amputation  33 y.o. female returns for post-op check.   Review of Systems: Negative except as noted in the HPI. Denies N/V/F/Ch.   Objective:  There were no vitals filed for this visit. There is no height or weight on file to calculate BMI. Constitutional Well developed. Well nourished.  Vascular Foot warm and well perfused. Capillary refill normal to all digits.  Calf is soft and supple, no posterior calf or knee pain, negative Homans' sign  Neurologic Normal speech. Oriented to person, place, and time. Epicritic sensation to light touch grossly present bilaterally.  Dermatologic Incision is well-healed  Orthopedic: No pain to palpation   Pathology showed no osteomyelitis at margin  Wound culture final growth negative  Assessment:   1. Abscess of third toe, left    Plan:  Patient was evaluated and treated and all questions answered.  S/p foot surgery left Sutures removed uneventfully.  Gradually return to shoe gear and activity as tolerated.  She returns to work next week.  Regular bathing and may apply lotion or Vaseline to incision.  Follow-up in 3 weeks for final evaluation we may consider further discussion of her digital deformities at that time.  No follow-ups on file.

## 2023-12-18 ENCOUNTER — Encounter: Admitting: Podiatry

## 2023-12-25 ENCOUNTER — Encounter: Payer: Self-pay | Admitting: Podiatry

## 2023-12-25 ENCOUNTER — Ambulatory Visit (INDEPENDENT_AMBULATORY_CARE_PROVIDER_SITE_OTHER): Admitting: Podiatry

## 2023-12-25 VITALS — Ht 60.0 in | Wt 130.0 lb

## 2023-12-25 DIAGNOSIS — M2032 Hallux varus (acquired), left foot: Secondary | ICD-10-CM

## 2023-12-25 DIAGNOSIS — M2042 Other hammer toe(s) (acquired), left foot: Secondary | ICD-10-CM

## 2023-12-27 NOTE — Progress Notes (Signed)
  Subjective:  Patient ID: Kathleen Rich, female    DOB: 25-Dec-1990,  MRN: [id number]  Chief Complaint  Patient presents with   Routine Post Op    POV # 3 DOS 11/07/23 --- LEFT 3RD PARTIAL AMPUTATION Foot is doing well no bandage regular shoe    DOS: 11/07/2023 Procedure: Partial left toe amputation  33 y.o. female returns for post-op check.  Doing well has not had any issues  Review of Systems: Negative except as noted in the HPI. Denies N/V/F/Ch.   Objective:  There were no vitals filed for this visit. Body mass index is 25.39 kg/m. Constitutional Well developed. Well nourished.  Vascular Foot warm and well perfused. Capillary refill normal to all digits.  Calf is soft and supple, no posterior calf or knee pain, negative Homans' sign  Neurologic Normal speech. Oriented to person, place, and time. Epicritic sensation to light touch grossly present bilaterally.  Dermatologic Incision is well-healed.  Not hypertrophic  Orthopedic: No pain to palpation.  Hallux malleus and hammertoe deformity of 1st and 2nd toe left foot prominent first metatarsal head.   Pathology showed no osteomyelitis at margin  Wound culture final growth negative  Assessment:   1. Hammertoe of left foot   2. Hallux malleus, left    Plan:  Patient was evaluated and treated and all questions answered.  S/p foot surgery left Doing well.  No further treatment needed for the third toe.  We discussed the hammered second toe and hallux valgus deformity and that long-term I do expect she will need surgical intervention to prevent ulceration here as well.  Will need second PIPJ arthrodesis, hallux IPJ arthrodesis with Jones transfer and likely hardware removal of the first ray.  No follow-ups on file.

## 2024-02-26 ENCOUNTER — Ambulatory Visit

## 2024-02-26 ENCOUNTER — Ambulatory Visit (INDEPENDENT_AMBULATORY_CARE_PROVIDER_SITE_OTHER)

## 2024-02-26 DIAGNOSIS — L97521 Non-pressure chronic ulcer of other part of left foot limited to breakdown of skin: Secondary | ICD-10-CM

## 2024-02-26 DIAGNOSIS — M2032 Hallux varus (acquired), left foot: Secondary | ICD-10-CM | POA: Diagnosis not present

## 2024-02-26 DIAGNOSIS — M2042 Other hammer toe(s) (acquired), left foot: Secondary | ICD-10-CM

## 2024-02-26 MED ORDER — GENTAMICIN SULFATE 0.1 % EX CREA: TOPICAL_CREAM | Freq: Every day | CUTANEOUS | 0 refills | Status: AC

## 2024-02-26 MED ORDER — DOXYCYCLINE HYCLATE 100 MG PO TABS: 100.0000 mg | ORAL_TABLET | Freq: Two times a day (BID) | ORAL | 0 refills | Status: AC

## 2024-02-26 NOTE — Progress Notes (Signed)
  Subjective:  Patient ID: Malin Sambrano, female    DOB: 29-Sep-1990,  MRN: [id number]  Chief Complaint  Patient presents with   Toe Injury    left foot fourth toe infected, lateral. Looks like a small laceration just under the nail x 2 days    33 y.o. female presents with concern for left foot fourth toe wound.  She is concerned that it is infected.  Does have a prior history of third toe partial amputation on the left foot.  She is unsure how the wound started.  Noticed increasing redness and swelling and came in for appointment.  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  Past Medical History:  Diagnosis Date   Headache    Medical history non-contributory    Ovarian cyst    Vaginal Pap smear, abnormal    ok since    Allergies  Allergen Reactions   Dilaudid [Hydromorphone Hcl] Rash    ROS: Negative except as per HPI above  Objective:  General: AAO x3, NAD  Dermatological: Small superficial ulceration present to the distal aspect of the left fourth toe appears as a small transverse laceration very superficial to limited to breakdown of skin.  Hyperkeratotic skin surrounding mild erythema and edema of the distal fourth  toe  Vascular:  Dorsalis Pedis artery and Posterior Tibial artery pedal pulses are 2/4 bilateral.  Capillary fill time < 3 sec to all digits.   Neruologic: Diminished sensation to the distal aspect of the left fourth toe  Musculoskeletal: Prior partial third toe amputation of the left foot well-healed.  Gait: Unassisted, Nonantalgic.     Radiographs:  Date: 02/26/2024 XR the left foot Weightbearing AP/Lateral/Oblique   Findings: no soft tissue emphysema, no signs of osteomyelitis at the distal phalanx of the fourth toe negative for erosions Assessment:   1. Ulcer of toe, left, limited to breakdown of skin (HCC)   2. Hammertoe of left foot   3. Hallux malleus, left      Plan:  Patient was evaluated and  treated and all questions answered.  # Ulceration distal aspect of the left fourth toe with limited breakdown of skin -Patient does have some cellulitis of distal aspect of the left fourth toe but there is no evidence of underlying osteomyelitis - Recommend daily dressing change with gentamicin  ointment and Band-Aid - E Rx for doxycycline  100 mg twice daily for days - Debrided the wound with tissue nipper today removal of hyperkeratotic tissue surrounding the ulcer. - Recommend offloading with postop shoe which she has from prior surgical intervention - Follow-up in 2 weeks to ensure improvement         Marolyn JULIANNA Honour, DPM [facility] / [facility]

## 2024-03-11 ENCOUNTER — Ambulatory Visit (INDEPENDENT_AMBULATORY_CARE_PROVIDER_SITE_OTHER): Admitting: Podiatry

## 2024-03-11 DIAGNOSIS — L97521 Non-pressure chronic ulcer of other part of left foot limited to breakdown of skin: Secondary | ICD-10-CM | POA: Diagnosis not present

## 2024-03-11 NOTE — Progress Notes (Signed)
  Subjective:  Patient ID: Kathleen Rich, female    DOB: Dec 24, 1990,  MRN: [id number]  Chief Complaint  Patient presents with   Toe Ulcer    Left 4th. Doing better. Denies drainage, intermittent discomfort. No redness.     33 y.o. female presents for follow up of left foot fourth toe ulcer. She has been taking doxycyline as instructed.  She reports area is looking better after proceeding with wound care and says the redness is gone away.  Past Medical History:  Diagnosis Date   Headache    Medical history non-contributory    Ovarian cyst    Vaginal Pap smear, abnormal    ok since    Allergies  Allergen Reactions   Dilaudid [Hydromorphone Hcl] Rash    ROS: Negative except as per HPI above  Objective:  General: AAO x3, NAD  Dermatological: Attention to the distal aspect of the left fourth toe.  There is no open wound.  Mild hyperkeratotic tissue at the distal tuft of the toe and some mild maceration of the surrounding skin.  No significant erythema and decreased edema from prior    Vascular:  Dorsalis Pedis artery and Posterior Tibial artery pedal pulses are 2/4 bilateral.  Capillary fill time < 3 sec to all digits.   Neruologic: Diminished sensation to the distal aspect of the left fourth toe  Musculoskeletal: Prior partial third toe amputation of the left foot well-healed.  Gait: Unassisted, Nonantalgic.   Radiographs:  Date: 02/26/2024 XR the left foot Weightbearing AP/Lateral/Oblique   Findings: no soft tissue emphysema, no signs of osteomyelitis at the distal phalanx of the fourth toe negative for erosions Assessment:   1. Ulcer of toe, left, limited to breakdown of skin [facility])       Plan:  Patient was evaluated and treated and all questions answered.  # Ulceration distal aspect of the left fourth toe with limited breakdown of skin - Appears to superficial ulceration is fully healed at this time.  She does have mild hyperkeratotic tissue which was debrided away  today. - No further gentamicin  ointment or Band-Aid dressing is needed leave it open and a lot of the surrounding macerated tissue to dry out -Provided a gel toe cap for pressure relief on the fourth toe - Status post doxycycline  no further antibiotics indicated -To be weightbearing as tolerated in regular shoes and monitor the area for worsening redness swelling or open wound - Follow-up as needed         Kathleen Rich, DPM [facility] / [facility]

## 2024-07-02 ENCOUNTER — Ambulatory Visit

## 2024-07-02 ENCOUNTER — Ambulatory Visit (INDEPENDENT_AMBULATORY_CARE_PROVIDER_SITE_OTHER)

## 2024-07-02 DIAGNOSIS — Q6672 Congenital pes cavus, left foot: Secondary | ICD-10-CM

## 2024-07-02 DIAGNOSIS — S90212A Contusion of left great toe with damage to nail, initial encounter: Secondary | ICD-10-CM

## 2024-07-02 DIAGNOSIS — M7752 Other enthesopathy of left foot: Secondary | ICD-10-CM

## 2024-07-02 DIAGNOSIS — G5782 Other specified mononeuropathies of left lower limb: Secondary | ICD-10-CM

## 2024-07-02 DIAGNOSIS — M7672 Peroneal tendinitis, left leg: Secondary | ICD-10-CM

## 2024-07-02 DIAGNOSIS — Z9889 Other specified postprocedural states: Secondary | ICD-10-CM

## 2024-07-02 NOTE — Progress Notes (Signed)
 Subjective:  Patient ID: Kathleen Rich, female    DOB: 1991/06/01,  MRN: [id number]  Chief Complaint  Patient presents with   Foot Pain    Has noticed L great toe nail discoloration x 4 months. no pain in toe, but some burning. L lateral ankle pain starting 1 week ago also having tingling in foot. Not diabetic no anti coag    Discussed the use of AI scribe software for clinical note transcription with the patient, who gave verbal consent to proceed.  History of Present Illness Kathleen Rich is a 33 year old female who presents with persistent foot pain and tingling.  She experiences constant pain in the foot, particularly around the third toe, following surgery for a high arch. 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 The left foot feels awkward and stiff. There are no recent changes in balance or frequent ankle sprains.  Tingling is present throughout the foot, with increased frequency recently. She experiences burning and tingling sensations, especially on the lateral side of the foot.   There is no diabetes, and she denies bleeding or significant trauma to the toenail, though sensation is limited in the area. She is concerned about the shape of her toes and toenails, noting issues with every toenail. There are no problems in the area of her surgical incisions.     Review of Systems: Negative except as noted in the HPI. Denies N/V/F/Ch.  Past Medical History:  Diagnosis Date   Headache    Medical history non-contributory    Ovarian cyst    Vaginal Pap smear, abnormal    ok since    Current Outpatient Medications:    gentamicin  cream (GARAMYCIN ) 0.1 %, Apply topically daily., Disp: 15 g, Rfl: 0  Social History   Tobacco Use  Smoking Status Never  Smokeless Tobacco Not on file    Allergies  Allergen Reactions   Dilaudid [Hydromorphone Hcl] Rash    Objective:   Constitutional Well developed. Well nourished. Oriented to person, place, and time.  Vascular Dorsalis pedis pulses palpable bilaterally. Posterior tibial pulses palpable bilaterally. Capillary refill normal to all digits.  No cyanosis or clubbing noted. Pedal hair growth normal.  Neurologic Normal speech. Epicritic sensation inconsistent with neuropathic changes laterally and somewhat plantarly. The remainder of her foot/ankle have adequate protective sensation.  Percussion of tibial nerve, sural nerve elicits pain and burning/tingling sensation but does not cause shooting sensations except for the sural nerve.  Dermatologic Skin texture and turgor are within normal limits.  No open wounds. No skin lesions. Subungual hematoma occupying approximately 25% of the nail x 4 months. No trauma, no signs of infection.   Musculoskeletal: 5 out of 5 muscle strength to all major pedal muscle groups including peroneal tendons.  There is pain with resisted eversion.  Pain to palpation of the peroneals and just posterior to the peroneals.  Cavus foot shape with calcaneus and mild varus.  Plantarflexed medial column.  Significant flexion contracture at hallux IPJ causing distal tuft to contact ground.  Hammertoe contractures to digits 2 through 5 that are partially reducible.  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  Minimal motion available through subtalar that is painful.   Radiographs: Taken and reviewed 3 views.  Postsurgical changes from calcaneal osteotomy with intact screw fixation and medial column dorsiflexor procedure with plate and screw  fixation, plate has fractured.  Osteotomy sites appear well-healed with union.  Significant increase in medial longitudinal arch height with increased calcaneal declination, decreased talar declination.  Decreased talonavicular uncoverage.    Assessment:   1. Capsulitis of metatarsophalangeal (MTP) joint of left foot   2.  Neuritis of left sural nerve   3. Pes cavus of left foot   4. Subungual hematoma of great toe of left foot, initial encounter   5. Peroneal tendonitis, left   6. Status post left foot surgery      Plan:  Patient was evaluated and treated and all questions answered.  Assessment and Plan Assessment & Plan Left foot neuropathic pain potentially due to sural nerve involvement vs peroneal tendonitis - Administered diagnostic sural nerve block with 1.5cc 2% lidocaine  and 1.5cc 0.5% Marcaine  to assess nerve involvement. Unfortunately she only had minor improvement with injection. Will treat as tendonitis and evaluate progress. -For the peroneal tendinitis, physical therapy was prescribed.  She is going to do this closer to her home.  She was also dispensed a Tri-Lock ankle brace to decrease inversion forces across the ankle and hindfoot.  This should offload the peroneals. - Discussed possible use of gabapentin in future to decrease neuropathic symptoms if she does not have improvement from physical therapy  High arch (pes cavus) deformity of left foot High arch deformity with previous surgical intervention.  - Continue to monitor symptoms and consider further surgical intervention if conservative therapy is ineffective. Will require advanced imaging prior to any surgical intervention.  - We discussed exhausting conservative measures as surgical intervention would require prolonged nonweightbearing and recovery       No follow-ups on file.   Prentice Ovens, DPM AACFAS Fellowship Trained Podiatric Surgeon [facility]

## 2024-07-10 ENCOUNTER — Ambulatory Visit (HOSPITAL_COMMUNITY)

## 2024-07-10 ENCOUNTER — Other Ambulatory Visit: Payer: Self-pay

## 2024-07-10 ENCOUNTER — Encounter (HOSPITAL_COMMUNITY): Payer: Self-pay

## 2024-07-10 DIAGNOSIS — Z7409 Other reduced mobility: Secondary | ICD-10-CM | POA: Insufficient documentation

## 2024-07-10 DIAGNOSIS — M25572 Pain in left ankle and joints of left foot: Secondary | ICD-10-CM | POA: Diagnosis present

## 2024-07-10 DIAGNOSIS — G8929 Other chronic pain: Secondary | ICD-10-CM | POA: Diagnosis present

## 2024-07-10 NOTE — Therapy (Signed)
 OUTPATIENT PHYSICAL THERAPY LOWER EXTREMITY EVALUATION   Patient Name: Kathleen Rich MRN: [id number] DOB:1990-12-05, 33 y.o., female Today's Date: 07/10/2024  END OF SESSION:  PT End of Session - 07/10/24 1120     Visit Number 1    Date for Recertification  08/21/24    Authorization Type [employer]BS COMM PPO    Authorization Time Period seeking auth    Authorization - Visit Number 1    Progress Note Due on Visit 10    PT Start Time 1034    PT Stop Time 1115    PT Time Calculation (min) 41 min    Activity Tolerance Patient tolerated treatment well;Patient limited by pain    Behavior During Therapy WFL for tasks assessed/performed          Past Medical History:  Diagnosis Date   Headache    Medical history non-contributory    Ovarian cyst    Vaginal Pap smear, abnormal    ok since   Past Surgical History:  Procedure Laterality Date   ANKLE SURGERY     BACK SURGERY     CESAREAN SECTION N/A 04/24/2015   Procedure: CESAREAN SECTION;  Surgeon: Norleen Skill, MD;  Location: [facility];  Service: Obstetrics;  Laterality: N/A;   SALPINGOOPHORECTOMY     Patient Active Problem List   Diagnosis Date Noted   S/P cesarean section 04/24/2015    Class: Status post   Preterm premature rupture of membranes (PPROM) with unknown onset of labor 04/22/2015   [redacted] weeks gestation of pregnancy    Abdominal pain affecting pregnancy    Variable fetal heart rate decelerations, antepartum    Threatened preterm labor    [redacted] weeks gestation of pregnancy    Decreased fetal movement     PCP: Atilano Deward ORN, MD   REFERRING PROVIDER: Magdalen Prentice PARAS, DPM  REFERRING DIAG: [phone] (ICD-10-CM) - Congenital pes cavus, left foot M76.72 (ICD-10-CM) - Peroneal tendinitis, left leg Z98.890 (ICD-10-CM) - Other specified postprocedural states  THERAPY DIAG:  Chronic pain of left ankle  Impaired functional mobility, balance, gait, and endurance  Rationale for Evaluation and Treatment: Rehabilitation  ONSET  DATE: ankle surgery 2018  SUBJECTIVE:   SUBJECTIVE STATEMENT: Pt states she has had several surgeries for the left ankle. Pt states pain is a 4/10 this date. Pt states she has been given the brace and has worn it since last week. Pt states doc was not sure if it was nerve or tendon related but leaning towards the tendons.   PERTINENT HISTORY: Several surgeries on L ankle Back surgery as a baby Pt states she was to have another surgery to even legs up, states the hips are unlevel PAIN:  Are you having pain? Yes: NPRS scale: 4/10 pain Pain location: outside of left ankle. Pain description: burning pain Aggravating factors: standing Relieving factors: sit down  PRECAUTIONS: None  RED FLAGS: None   WEIGHT BEARING RESTRICTIONS: No  FALLS:  Has patient fallen in last 6 months? No  OCCUPATION: preschooler teacher  PLOF: Independent and Independent with basic ADLs  PATIENT GOALS: decrease the pain with standing and walking, increased strength of LLE  NEXT MD VISIT: 6 weeks  OBJECTIVE:  Note: Objective measures were completed at Evaluation unless otherwise noted.  DIAGNOSTIC FINDINGS: unable to see impression  PATIENT SURVEYS:  LEFS : 56 / 80 = 70.0 %  SENSATION: Light touch: Impaired , pt states pretty much whole foot feels numb, sitting will cause foot to fall asleep  PALPATION:  Pt demonstrates increased tenderness to lateral compartment of lower calf and posterior and inferior to lateral malleolus as well as to surgical plate in mid foot region.  LOWER EXTREMITY ROM:  Active ROM Right eval Left eval  Hip flexion    Hip extension    Hip abduction    Hip adduction    Hip internal rotation    Hip external rotation    Knee flexion    Knee extension    Ankle dorsiflexion  14 from neutral, little pain  Ankle plantarflexion  38, little pain  Ankle inversion  17  Ankle eversion  14   (Blank rows = not tested)  LOWER EXTREMITY MMT:  MMT Right eval Left eval   Hip flexion    Hip extension    Hip abduction    Hip adduction    Hip internal rotation    Hip external rotation    Knee flexion 4+ 4  Knee extension 4+ 4  Ankle dorsiflexion  3  Ankle plantarflexion  3  Ankle inversion  3  Ankle eversion  3   (Blank rows = not tested)    FUNCTIONAL TESTS:  2 minute walk test: 354 feet, increased tingling in left foot  SLS 07/10/24: R: 30 seconds L: 26.71, increased pain  GAIT: Distance walked: 400 feet, no brace Assistive device utilized: None Level of assistance: Complete Independence Comments: Pt demonstrates antalgic pattern, pain and leg length discrepancy could be contributing factors. Pt demonstrates decreased stance time on LLE and increased numb/tingling feeling on L foot.                                                                                                                                TREATMENT DATE:  07/10/2024   Evaluation: -ROM measured, Strength assessed, HEP prescribed, pt educated on prognosis, findings, and importance of HEP compliance if given.         PATIENT EDUCATION:  Education details: Pt was educated on findings of PT evaluation, prognosis, frequency of therapy visits and rationale, attendance policy, and HEP if given.   Person educated: Patient Education method: Explanation, Verbal cues, and Handouts Education comprehension: verbalized understanding, verbal cues required, and needs further education  HOME EXERCISE PROGRAM: Access Code: [email or web address] URL: [email or web address] Date: 07/10/2024 Prepared by: Lang Ada  Exercises - Single Leg Stance  - 1 x daily - 7 x weekly - 1 sets - 3 reps - 30 hold - Seated Ankle Eversion with Resistance  - 1 x daily - 7 x weekly - 3 sets - 10 reps - Toe Raise With Back Against Wall  - 1 x daily - 7 x weekly - 3 sets - 10 reps - Heel Raises with Counter Support  - 1 x daily - 7 x weekly - 3 sets - 10 reps  ASSESSMENT:  CLINICAL  IMPRESSION: Patient is a 33 y.o. female who was seen today for physical therapy evaluation and treatment for  Q66.72 (ICD-10-CM) - Congenital pes cavus, left foot M76.72 (ICD-10-CM) - Peroneal tendinitis, left leg Z98.890 (ICD-10-CM) - Other specified postprocedural states.  Patient demonstrates decreased LE strength, abnormal pain rating in LLE, and fair balance. Patient also demonstrates difficulty with ambulation during today's session with decreased stride length and velocity noted. Patient also demonstrates increased pain and LOB at 26 seconds of SLS on LLE. Patient requires education on role of PT, realistic goals for therapy, findings of examination, HEP and overall POC. Patient would benefit from skilled physical therapy for decreased LLE pain, increased endurance with ambulation, increased LLE strength, and balance for improved gait quality, return to higher level of function with ADLs, and progress towards therapy goals.    OBJECTIVE IMPAIRMENTS: Abnormal gait, decreased balance, decreased endurance, decreased knowledge of use of DME, decreased mobility, difficulty walking, decreased ROM, decreased strength, hypomobility, impaired flexibility, improper body mechanics, postural dysfunction, and pain.   ACTIVITY LIMITATIONS: carrying, lifting, bending, standing, squatting, stairs, transfers, and bed mobility  PARTICIPATION LIMITATIONS: meal prep, cleaning, laundry, shopping, community activity, occupation, and yard work  PERSONAL FACTORS: Age, Past/current experiences, Time since onset of injury/illness/exacerbation, and 1 comorbidity: multiple ankle surgeries on LLE are also affecting patient's functional outcome.   REHAB POTENTIAL: Fair chronic in nature  CLINICAL DECISION MAKING: Stable/uncomplicated  EVALUATION COMPLEXITY: Low   GOALS: Goals reviewed with patient? No  SHORT TERM GOALS: Target date: 07/31/24  Patient will demonstrate evidence of independence with individualized  HEP and will report compliance for at least 3 days per week for optimized progression towards remaining therapy goals. Baseline:  Goal status: INITIAL  2.  Patient will report a decrease in pain level during community ambulation by at least 2 points for improved quality of life. Baseline: 4/10 Goal status: INITIAL     LONG TERM GOALS: Target date: 08/22/23  Pt will demonstrate a an increase of at least 9 points on the LEFS for improved performance of community ambulation and ADL. Baseline: see objective Goal status: INITIAL  2.  Pt will improve 2 MWT by at least 50 feet in order to demonstrate improved functional ambulatory capacity in community setting.  Baseline: see objective Goal status: INITIAL  3.  Pt will demonstrate an increase of at least 10 degrees combined ROM (flexion and extension) in left ankle, for increased mobility and maximal efficiency of gait cycle during ambulation. Baseline: see objective Goal status: INITIAL  4.  Pt will demonstrate at least 4-/5 MMT for left lower extremity for increased strength during ADL and community ambulation. Baseline: see objective Goal status: INITIAL  5.  Pt will improve SLS on LLE to at least 30 seconds with no increase in pain in order to improve balance during functional activities. Baseline: see objective Goal status: INITIAL    PLAN:  PT FREQUENCY: 1-2x/week  PT DURATION: 6 weeks  PLANNED INTERVENTIONS: 97110-Therapeutic exercises, 97530- Therapeutic activity, 97112- Neuromuscular re-education, 97535- Self Care, 02859- Manual therapy, [phone]- Gait training, [phone] (1-2 muscles), [id number] (3+ muscles)- Dry Needling, Patient/Family education, Balance training, Stair training, Joint mobilization, Joint manipulation, DME instructions, Cryotherapy, and Moist heat  PLAN FOR NEXT SESSION: review goals and HEP, progress HEP as tolerated, progress endurance and LE strengthening, advanced balance activities, consider manual techniques  for desensitization   Note from surgeon: S/P surgery, Strengthen peroneal tendons, work on desensitization lateral foot, work on gait normalization (2/week x 6 weeks)  Lang Ada, PT, DPT [facility] Office: [phone] 11:44 AM, 07/10/24   Managed Medicaid Authorization  Request Treatment Start Date: 08-04-24  Visit Dx Codes: F74.427, G89.29; Z74.09  Functional Tool Score: LEFS : 56 / 80 = 70.0 %  For all possible CPT codes, reference the Planned Interventions line above.     Check all conditions that are expected to impact treatment: {Conditions expected to impact treatment:Contractures, spasticity or fracture relevant to requested treatment, Structural or anatomic abnormalities, Complications related to surgery, and Unknown   If treatment provided at initial evaluation, no treatment charged due to lack of authorization.

## 2024-07-15 ENCOUNTER — Ambulatory Visit (HOSPITAL_COMMUNITY)

## 2024-07-15 DIAGNOSIS — G8929 Other chronic pain: Secondary | ICD-10-CM

## 2024-07-15 DIAGNOSIS — Z7409 Other reduced mobility: Secondary | ICD-10-CM

## 2024-07-15 DIAGNOSIS — M25572 Pain in left ankle and joints of left foot: Secondary | ICD-10-CM | POA: Diagnosis not present

## 2024-07-15 NOTE — Therapy (Signed)
 OUTPATIENT PHYSICAL THERAPY LOWER EXTREMITY TREATMENT   Patient Name: Kathleen Rich MRN: [id number] DOB:04-Mar-1991, 33 y.o., female Today's Date: 07/15/2024  END OF SESSION:  PT End of Session - 07/15/24 1145     Visit Number 2    Number of Visits 12    Date for Recertification  08/21/24    Authorization Type [employer] COMM PPO    Authorization Time Period seeking auth    Progress Note Due on Visit 10    PT Start Time 1115    PT Stop Time 1155    PT Time Calculation (min) 40 min    Activity Tolerance Patient tolerated treatment well;Patient limited by pain    Behavior During Therapy WFL for tasks assessed/performed           Past Medical History:  Diagnosis Date   Headache    Medical history non-contributory    Ovarian cyst    Vaginal Pap smear, abnormal    ok since   Past Surgical History:  Procedure Laterality Date   ANKLE SURGERY     BACK SURGERY     CESAREAN SECTION N/A 04/24/2015   Procedure: CESAREAN SECTION;  Surgeon: Norleen Skill, MD;  Location: [facility];  Service: Obstetrics;  Laterality: N/A;   SALPINGOOPHORECTOMY     Patient Active Problem List   Diagnosis Date Noted   S/P cesarean section 04/24/2015    Class: Status post   Preterm premature rupture of membranes (PPROM) with unknown onset of labor 04/22/2015   [redacted] weeks gestation of pregnancy    Abdominal pain affecting pregnancy    Variable fetal heart rate decelerations, antepartum    Threatened preterm labor    [redacted] weeks gestation of pregnancy    Decreased fetal movement     PCP: Sasser, Deward ORN, MD   REFERRING PROVIDER: Magdalen Prentice PARAS, DPM  REFERRING DIAG: [phone] (ICD-10-CM) - Congenital pes cavus, left foot M76.72 (ICD-10-CM) - Peroneal tendinitis, left leg Z98.890 (ICD-10-CM) - Other specified postprocedural states  THERAPY DIAG:  Chronic pain of left ankle  Impaired functional mobility, balance, gait, and endurance  Rationale for Evaluation and Treatment: Rehabilitation  ONSET DATE: ankle  surgery 2018  SUBJECTIVE:   SUBJECTIVE STATEMENT: No pain on arrival this morning; states she usually has more pain later in the day after being up on it.  5/10 yesterday evening  Eval:Pt states she has had several surgeries for the left ankle. Pt states pain is a 4/10 this date. Pt states she has been given the brace and has worn it since last week. Pt states doc was not sure if it was nerve or tendon related but leaning towards the tendons.   PERTINENT HISTORY: Several surgeries on L ankle Back surgery as a baby Pt states she was to have another surgery to even legs up, states the hips are unlevel PAIN:  Are you having pain? Yes: NPRS scale: 4/10 pain Pain location: outside of left ankle. Pain description: burning pain Aggravating factors: standing Relieving factors: sit down  PRECAUTIONS: None  RED FLAGS: None   WEIGHT BEARING RESTRICTIONS: No  FALLS:  Has patient fallen in last 6 months? No  OCCUPATION: preschooler teacher  PLOF: Independent and Independent with basic ADLs  PATIENT GOALS: decrease the pain with standing and walking, increased strength of LLE  NEXT MD VISIT: 6 weeks  OBJECTIVE:  Note: Objective measures were completed at Evaluation unless otherwise noted.  DIAGNOSTIC FINDINGS: unable to see impression  PATIENT SURVEYS:  LEFS : 56 / 80 =  70.0 %  SENSATION: Light touch: Impaired , pt states pretty much whole foot feels numb, sitting will cause foot to fall asleep  PALPATION: Pt demonstrates increased tenderness to lateral compartment of lower calf and posterior and inferior to lateral malleolus as well as to surgical plate in mid foot region.  LOWER EXTREMITY ROM:  Active ROM Right eval Left eval  Hip flexion    Hip extension    Hip abduction    Hip adduction    Hip internal rotation    Hip external rotation    Knee flexion    Knee extension    Ankle dorsiflexion  14 from neutral, little pain  Ankle plantarflexion  38, little pain   Ankle inversion  17  Ankle eversion  14   (Blank rows = not tested)  LOWER EXTREMITY MMT:  MMT Right eval Left eval  Hip flexion    Hip extension    Hip abduction    Hip adduction    Hip internal rotation    Hip external rotation    Knee flexion 4+ 4  Knee extension 4+ 4  Ankle dorsiflexion  3  Ankle plantarflexion  3  Ankle inversion  3  Ankle eversion  3   (Blank rows = not tested)    FUNCTIONAL TESTS:  2 minute walk test: 354 feet, increased tingling in left foot  SLS 07/10/24: R: 30 seconds L: 26.71, increased pain  GAIT: Distance walked: 400 feet, no brace Assistive device utilized: None Level of assistance: Complete Independence Comments: Pt demonstrates antalgic pattern, pain and leg length discrepancy could be contributing factors. Pt demonstrates decreased stance time on LLE and increased numb/tingling feeling on L foot.                                                                                                                                TREATMENT DATE:  07/15/24 Nustep seat 4 level 3 x 5' dynamic warm up Heel raises on incline with tennis ball between heels 2 x 10 Toe raises on decline 2 x 10 Slant board 5 x 20 Trial of 8 box repeated left knee drives but this increases pain in her medial left knee so d/c Squat to chair 2 x 10 BAPS board level 2 CW and CCW 2 x 10 Rocker board F/B and S/S 2 x 1' each Tandem stance x 30 each Tandem stance x 30 each on foam pad Seated 4# heel raises x 15   07/10/2024   Evaluation: -ROM measured, Strength assessed, HEP prescribed, pt educated on prognosis, findings, and importance of HEP compliance if given.         PATIENT EDUCATION:  Education details: Pt was educated on findings of PT evaluation, prognosis, frequency of therapy visits and rationale, attendance policy, and HEP if given.   Person educated: Patient Education method: Explanation, Verbal cues, and Handouts Education comprehension:  verbalized understanding, verbal cues required, and needs further education  HOME EXERCISE PROGRAM: Access Code: [email or web address] URL: [email or web address] Date: 07/10/2024 Prepared by: Lang Ada  Exercises - Single Leg Stance  - 1 x daily - 7 x weekly - 1 sets - 3 reps - 30 hold - Seated Ankle Eversion with Resistance  - 1 x daily - 7 x weekly - 3 sets - 10 reps - Toe Raise With Back Against Wall  - 1 x daily - 7 x weekly - 3 sets - 10 reps - Heel Raises with Counter Support  - 1 x daily - 7 x weekly - 3 sets - 10 reps  ASSESSMENT:  CLINICAL IMPRESSION: Today's session started with a review of goals while patient on Nustep for a warm up.  She verbalizes agreement with set rehab goals.  Continued with exercises to promote left ankle mobility and strength.  Of note trial of knee drives for flexion cause medial left knee pain so discontinued.  Significant girth difference right calf versus left.  Able to perform tandem stance fairly easily so increased challenge with using foam pad.  More challenge with left foot behind versus right in tandem stance.  Patient will benefit from continued skilled therapy services to address deficits and promote return to optimal function.       Eval:Patient is a 33 y.o. female who was seen today for physical therapy evaluation and treatment for Q66.72 (ICD-10-CM) - Congenital pes cavus, left foot M76.72 (ICD-10-CM) - Peroneal tendinitis, left leg Z98.890 (ICD-10-CM) - Other specified postprocedural states.  Patient demonstrates decreased LE strength, abnormal pain rating in LLE, and fair balance. Patient also demonstrates difficulty with ambulation during today's session with decreased stride length and velocity noted. Patient also demonstrates increased pain and LOB at 26 seconds of SLS on LLE. Patient requires education on role of PT, realistic goals for therapy, findings of examination, HEP and overall POC. Patient would benefit from skilled physical  therapy for decreased LLE pain, increased endurance with ambulation, increased LLE strength, and balance for improved gait quality, return to higher level of function with ADLs, and progress towards therapy goals.    OBJECTIVE IMPAIRMENTS: Abnormal gait, decreased balance, decreased endurance, decreased knowledge of use of DME, decreased mobility, difficulty walking, decreased ROM, decreased strength, hypomobility, impaired flexibility, improper body mechanics, postural dysfunction, and pain.   ACTIVITY LIMITATIONS: carrying, lifting, bending, standing, squatting, stairs, transfers, and bed mobility  PARTICIPATION LIMITATIONS: meal prep, cleaning, laundry, shopping, community activity, occupation, and yard work  PERSONAL FACTORS: Age, Past/current experiences, Time since onset of injury/illness/exacerbation, and 1 comorbidity: multiple ankle surgeries on LLE are also affecting patient's functional outcome.   REHAB POTENTIAL: Fair chronic in nature  CLINICAL DECISION MAKING: Stable/uncomplicated  EVALUATION COMPLEXITY: Low   GOALS: Goals reviewed with patient? No  SHORT TERM GOALS: Target date: 07/31/24  Patient will demonstrate evidence of independence with individualized HEP and will report compliance for at least 3 days per week for optimized progression towards remaining therapy goals. Baseline:  Goal status: IN PROGRESS  2.  Patient will report a decrease in pain level during community ambulation by at least 2 points for improved quality of life. Baseline: 4/10 Goal status: IN PROGRESS     LONG TERM GOALS: Target date: 08/22/23  Pt will demonstrate a an increase of at least 9 points on the LEFS for improved performance of community ambulation and ADL. Baseline: see objective Goal status: IN PROGRESS  2.  Pt will improve 2 MWT by at least 50 feet in order to demonstrate improved functional ambulatory  capacity in community setting.  Baseline: see objective Goal status: IN  PROGRESS  3.  Pt will demonstrate an increase of at least 10 degrees combined ROM (flexion and extension) in left ankle, for increased mobility and maximal efficiency of gait cycle during ambulation. Baseline: see objective Goal status: IN PROGRESS  4.  Pt will demonstrate at least 4-/5 MMT for left lower extremity for increased strength during ADL and community ambulation. Baseline: see objective Goal status: IN PROGRESS  5.  Pt will improve SLS on LLE to at least 30 seconds with no increase in pain in order to improve balance during functional activities. Baseline: see objective Goal status: IN PROGRESS    PLAN:  PT FREQUENCY: 1-2x/week  PT DURATION: 6 weeks  PLANNED INTERVENTIONS: 97110-Therapeutic exercises, 97530- Therapeutic activity, 97112- Neuromuscular re-education, 97535- Self Care, 02859- Manual therapy, [phone]- Gait training, [phone] (1-2 muscles), 20561 (3+ muscles)- Dry Needling, Patient/Family education, Balance training, Stair training, Joint mobilization, Joint manipulation, DME instructions, Cryotherapy, and Moist heat  PLAN FOR NEXT SESSION:  progress HEP as tolerated, progress endurance and LE strengthening, advanced balance activities, consider manual techniques for desensitization   Note from surgeon: S/P surgery, Strengthen peroneal tendons, work on desensitization lateral foot, work on gait normalization (2/week x 6 weeks)  11:54 AM, 07/15/24 Lynae Pederson Small Rubin Dais MPT [facility] [phone] [phone]    Managed Medicaid Authorization Request Treatment Start Date: 07/14/24  Visit Dx Codes: F74.427, G89.29; Z74.09  Functional Tool Score: LEFS : 56 / 80 = 70.0 %  For all possible CPT codes, reference the Planned Interventions line above.     Check all conditions that are expected to impact treatment: {Conditions expected to impact treatment:Contractures, spasticity or fracture relevant to requested treatment, Structural or anatomic  abnormalities, Complications related to surgery, and Unknown   If treatment provided at initial evaluation, no treatment charged due to lack of authorization.

## 2024-07-16 ENCOUNTER — Ambulatory Visit (HOSPITAL_COMMUNITY): Admitting: Physical Therapy

## 2024-07-16 DIAGNOSIS — M25572 Pain in left ankle and joints of left foot: Secondary | ICD-10-CM | POA: Diagnosis not present

## 2024-07-16 DIAGNOSIS — G8929 Other chronic pain: Secondary | ICD-10-CM

## 2024-07-16 DIAGNOSIS — Z7409 Other reduced mobility: Secondary | ICD-10-CM

## 2024-07-16 NOTE — Therapy (Signed)
 OUTPATIENT PHYSICAL THERAPY LOWER EXTREMITY TREATMENT   Patient Name: Kathleen Rich MRN: [id number] DOB:1990/09/17, 33 y.o., female Today's Date: 07/16/2024  END OF SESSION:     Past Medical History:  Diagnosis Date   Headache    Medical history non-contributory    Ovarian cyst    Vaginal Pap smear, abnormal    ok since   Past Surgical History:  Procedure Laterality Date   ANKLE SURGERY     BACK SURGERY     CESAREAN SECTION N/A 04/24/2015   Procedure: CESAREAN SECTION;  Surgeon: Norleen Skill, MD;  Location: [facility];  Service: Obstetrics;  Laterality: N/A;   SALPINGOOPHORECTOMY     Patient Active Problem List   Diagnosis Date Noted   S/P cesarean section 04/24/2015    Class: Status post   Preterm premature rupture of membranes (PPROM) with unknown onset of labor 04/22/2015   [redacted] weeks gestation of pregnancy    Abdominal pain affecting pregnancy    Variable fetal heart rate decelerations, antepartum    Threatened preterm labor    [redacted] weeks gestation of pregnancy    Decreased fetal movement     PCP: Sasser, Deward ORN, MD   REFERRING PROVIDER: Magdalen Prentice PARAS, DPM  REFERRING DIAG: [phone] (ICD-10-CM) - Congenital pes cavus, left foot M76.72 (ICD-10-CM) - Peroneal tendinitis, left leg Z98.890 (ICD-10-CM) - Other specified postprocedural states  THERAPY DIAG:  Chronic pain of left ankle  Impaired functional mobility, balance, gait, and endurance  Rationale for Evaluation and Treatment: Rehabilitation  ONSET DATE: ankle surgery 2018  SUBJECTIVE:   SUBJECTIVE STATEMENT: Pt states she had increased pain yesterday following therapy and into the night as high as 6/10.  Currently her pain is 4/10 and more soreness than anything. Pt worked this morning but is off until Monday.   Eval:Pt states she has had several surgeries for the left ankle. Pt states pain is a 4/10 this date. Pt states she has been given the brace and has worn it since last week. Pt states doc was not sure if  it was nerve or tendon related but leaning towards the tendons.   PERTINENT HISTORY: Several surgeries on L ankle Back surgery as a baby Pt states she was to have another surgery to even legs up, states the hips are unlevel PAIN:  Are you having pain? Yes: NPRS scale: 4/10 pain Pain location: outside of left ankle. Pain description: burning pain Aggravating factors: standing Relieving factors: sit down  PRECAUTIONS: None  RED FLAGS: None   WEIGHT BEARING RESTRICTIONS: No  FALLS:  Has patient fallen in last 6 months? No  OCCUPATION: preschooler teacher  PLOF: Independent and Independent with basic ADLs  PATIENT GOALS: decrease the pain with standing and walking, increased strength of LLE  NEXT MD VISIT: 6 weeks  OBJECTIVE:  Note: Objective measures were completed at Evaluation unless otherwise noted.  DIAGNOSTIC FINDINGS: unable to see impression  PATIENT SURVEYS:  LEFS : 56 / 80 = 70.0 %  SENSATION: Light touch: Impaired , pt states pretty much whole foot feels numb, sitting will cause foot to fall asleep  PALPATION: Pt demonstrates increased tenderness to lateral compartment of lower calf and posterior and inferior to lateral malleolus as well as to surgical plate in mid foot region.  LOWER EXTREMITY ROM:  Active ROM Right eval Left eval  Hip flexion    Hip extension    Hip abduction    Hip adduction    Hip internal rotation    Hip external  rotation    Knee flexion    Knee extension    Ankle dorsiflexion  14 from neutral, little pain  Ankle plantarflexion  38, little pain  Ankle inversion  17  Ankle eversion  14   (Blank rows = not tested)  LOWER EXTREMITY MMT:  MMT Right eval Left eval  Hip flexion    Hip extension    Hip abduction    Hip adduction    Hip internal rotation    Hip external rotation    Knee flexion 4+ 4  Knee extension 4+ 4  Ankle dorsiflexion  3  Ankle plantarflexion  3  Ankle inversion  3  Ankle eversion  3   (Blank  rows = not tested)    FUNCTIONAL TESTS:  2 minute walk test: 354 feet, increased tingling in left foot  SLS 07/10/24: R: 30 seconds L: 26.71, increased pain  GAIT: Distance walked: 400 feet, no brace Assistive device utilized: None Level of assistance: Complete Independence Comments: Pt demonstrates antalgic pattern, pain and leg length discrepancy could be contributing factors. Pt demonstrates decreased stance time on LLE and increased numb/tingling feeling on L foot.                                                                                                                                TREATMENT DATE:  07/16/24 Nustep seat 3 level 3 x 5' dynamic warm up Standing at // bars: Heel raises on incline with tennis ball between heels 2 x 10  Lt only heel raise only 10X with 1 UE assist Toe raises on decline 2 x 10 Slant board 5 x 20 Rocker board F/B and S/S 2 x 2' each Tandem stance x 30 each on foam pad SLS 30 bilaterally, advance to foam Lt only max of 5 without UE assist Seated BAPS board level 2 CW and CCW 2 x 10 Seated 4# heel raises x 15 Squat to chair 2 x 10  07/15/24 Nustep seat 4 level 3 x 5' dynamic warm up Heel raises on incline with tennis ball between heels 2 x 10 Toe raises on decline 2 x 10 Slant board 5 x 20 Trial of 8 box repeated left knee drives but this increases pain in her medial left knee so d/c Squat to chair 2 x 10 BAPS board level 2 CW and CCW 2 x 10 Rocker board F/B and S/S 2 x 1' each Tandem stance x 30 each Tandem stance x 30 each on foam pad Seated 4# heel raises x 15   07/10/2024   Evaluation: -ROM measured, Strength assessed, HEP prescribed, pt educated on prognosis, findings, and importance of HEP compliance if given.         PATIENT EDUCATION:  Education details: Pt was educated on findings of PT evaluation, prognosis, frequency of therapy visits and rationale, attendance policy, and HEP if given.   Person educated:  Patient Education method: Explanation, Verbal cues,  and Handouts Education comprehension: verbalized understanding, verbal cues required, and needs further education  HOME EXERCISE PROGRAM: Access Code: [email or web address] URL: [email or web address] Date: 07/10/2024 Prepared by: Lang Ada  Exercises - Single Leg Stance  - 1 x daily - 7 x weekly - 1 sets - 3 reps - 30 hold - Seated Ankle Eversion with Resistance  - 1 x daily - 7 x weekly - 3 sets - 10 reps - Toe Raise With Back Against Wall  - 1 x daily - 7 x weekly - 3 sets - 10 reps - Heel Raises with Counter Support  - 1 x daily - 7 x weekly - 3 sets - 10 reps  ASSESSMENT:  CLINICAL IMPRESSION: Began warm up on nustep and continued with focus on ankle mobility, strength and stabilization.  Noted atrophy of Lt calf with addition of Lt only heelraise to focus on recruitment of that side only.  Pt was able to complete full 30 seconds with SLS on Lt so advance to foam surface.  Laterals on rockerboard more challenging than anterior/posterior, especially going to Lt side.  Pt with extreme tightness in bil calves as demonstrated with gastroc stretch.  PT reported no issues with sensitivity so did not introduce desensitization techniques.   Patient will benefit from continued skilled therapy services to address deficits and promote return to optimal function.     Eval:Patient is a 33 y.o. female who was seen today for physical therapy evaluation and treatment for Q66.72 (ICD-10-CM) - Congenital pes cavus, left foot M76.72 (ICD-10-CM) - Peroneal tendinitis, left leg Z98.890 (ICD-10-CM) - Other specified postprocedural states.  Patient demonstrates decreased LE strength, abnormal pain rating in LLE, and fair balance. Patient also demonstrates difficulty with ambulation during today's session with decreased stride length and velocity noted. Patient also demonstrates increased pain and LOB at 26 seconds of SLS on LLE. Patient requires education on  role of PT, realistic goals for therapy, findings of examination, HEP and overall POC. Patient would benefit from skilled physical therapy for decreased LLE pain, increased endurance with ambulation, increased LLE strength, and balance for improved gait quality, return to higher level of function with ADLs, and progress towards therapy goals.    OBJECTIVE IMPAIRMENTS: Abnormal gait, decreased balance, decreased endurance, decreased knowledge of use of DME, decreased mobility, difficulty walking, decreased ROM, decreased strength, hypomobility, impaired flexibility, improper body mechanics, postural dysfunction, and pain.   ACTIVITY LIMITATIONS: carrying, lifting, bending, standing, squatting, stairs, transfers, and bed mobility  PARTICIPATION LIMITATIONS: meal prep, cleaning, laundry, shopping, community activity, occupation, and yard work  PERSONAL FACTORS: Age, Past/current experiences, Time since onset of injury/illness/exacerbation, and 1 comorbidity: multiple ankle surgeries on LLE are also affecting patient's functional outcome.   REHAB POTENTIAL: Fair chronic in nature  CLINICAL DECISION MAKING: Stable/uncomplicated  EVALUATION COMPLEXITY: Low   GOALS: Goals reviewed with patient? No  SHORT TERM GOALS: Target date: 07/31/24  Patient will demonstrate evidence of independence with individualized HEP and will report compliance for at least 3 days per week for optimized progression towards remaining therapy goals. Baseline:  Goal status: IN PROGRESS  2.  Patient will report a decrease in pain level during community ambulation by at least 2 points for improved quality of life. Baseline: 4/10 Goal status: IN PROGRESS     LONG TERM GOALS: Target date: 08/22/23  Pt will demonstrate a an increase of at least 9 points on the LEFS for improved performance of community ambulation and ADL. Baseline: see objective Goal status: IN PROGRESS  2.  Pt will improve 2 MWT by at least 50 feet  in order to demonstrate improved functional ambulatory capacity in community setting.  Baseline: see objective Goal status: IN PROGRESS  3.  Pt will demonstrate an increase of at least 10 degrees combined ROM (flexion and extension) in left ankle, for increased mobility and maximal efficiency of gait cycle during ambulation. Baseline: see objective Goal status: IN PROGRESS  4.  Pt will demonstrate at least 4-/5 MMT for left lower extremity for increased strength during ADL and community ambulation. Baseline: see objective Goal status: IN PROGRESS  5.  Pt will improve SLS on LLE to at least 30 seconds with no increase in pain in order to improve balance during functional activities. Baseline: see objective Goal status: IN PROGRESS    PLAN:  PT FREQUENCY: 1-2x/week  PT DURATION: 6 weeks  PLANNED INTERVENTIONS: 97110-Therapeutic exercises, 97530- Therapeutic activity, [id number]- Neuromuscular re-education, 97535- Self Care, 02859- Manual therapy, 97116- Gait training, [phone] (1-2 muscles), 20561 (3+ muscles)- Dry Needling, Patient/Family education, Balance training, Stair training, Joint mobilization, Joint manipulation, DME instructions, Cryotherapy, and Moist heat  PLAN FOR NEXT SESSION:  progress HEP as tolerated, progress endurance and LE strengthening, advanced balance activities.   Note from surgeon: S/P surgery, Strengthen peroneal tendons, work on desensitization lateral foot, work on gait normalization (2/week x 6 weeks)  11:21 AM, 07/16/24 Greig KATHEE Fuse, PTA/CLT [facility] Ph: [phone]

## 2024-08-05 ENCOUNTER — Encounter (HOSPITAL_COMMUNITY): Payer: Self-pay

## 2024-08-05 NOTE — Therapy (Signed)
 [facility] Outpatient Rehabilitation at [facility] [address] [place], [place], [address] Phone: [phone]   Fax:  [phone]  Patient Details  Name: Ashyah Quizon MRN: [id number] Date of Birth: 1991-07-01 Referring Provider:  No ref. provider found  Encounter Date: 08/05/2024 PHYSICAL THERAPY DISCHARGE SUMMARY  Visits from Start of Care: 3  Current functional level related to goals / functional outcomes: unknown   Remaining deficits: unknown   Education / Equipment: HEP   Patient agrees to discharge. Patient goals were not met. Patient is being discharged due to patient being unable to get off work to attend PT appts. [facility] Outpatient Rehabilitation at [facility] [address] [place], [place], [address] Phone: [phone]   Fax:  [phone]

## 2024-08-06 ENCOUNTER — Ambulatory Visit (HOSPITAL_COMMUNITY)

## 2024-08-12 ENCOUNTER — Ambulatory Visit

## 2024-08-12 ENCOUNTER — Ambulatory Visit (HOSPITAL_COMMUNITY): Admitting: Physical Therapy

## 2024-08-22 ENCOUNTER — Ambulatory Visit (HOSPITAL_COMMUNITY)

## 2024-08-26 ENCOUNTER — Ambulatory Visit (HOSPITAL_COMMUNITY)
